# Patient Record
Sex: Male | Born: 1935 | Race: White | Hispanic: No | Smoking: Former smoker
Health system: Southern US, Community
[De-identification: ages and names within clinical notes are randomized; demographics above are authoritative.]

## PROBLEM LIST (undated history)

## (undated) DIAGNOSIS — I2510 Atherosclerotic heart disease of native coronary artery without angina pectoris: Secondary | ICD-10-CM

## (undated) DIAGNOSIS — I509 Heart failure, unspecified: Secondary | ICD-10-CM

## (undated) DIAGNOSIS — N189 Chronic kidney disease, unspecified: Secondary | ICD-10-CM

## (undated) DIAGNOSIS — I10 Essential (primary) hypertension: Secondary | ICD-10-CM

## (undated) DIAGNOSIS — I499 Cardiac arrhythmia, unspecified: Secondary | ICD-10-CM

## (undated) DIAGNOSIS — N400 Enlarged prostate without lower urinary tract symptoms: Secondary | ICD-10-CM

## (undated) DIAGNOSIS — E785 Hyperlipidemia, unspecified: Secondary | ICD-10-CM

## (undated) DIAGNOSIS — I4891 Unspecified atrial fibrillation: Secondary | ICD-10-CM

## (undated) DIAGNOSIS — M109 Gout, unspecified: Secondary | ICD-10-CM

## (undated) HISTORY — PX: HERNIA REPAIR: SHX51

## (undated) HISTORY — PX: FRACTURE SURGERY: SHX138

## (undated) HISTORY — PX: CARDIAC CATHETERIZATION: SHX172

---

## 1998-02-26 HISTORY — PX: ABDOMINAL AORTIC ANEURYSM REPAIR: SUR1152

## 1998-02-26 HISTORY — PX: CORONARY ANGIOPLASTY: SHX604

## 2000-02-27 HISTORY — PX: FEMORAL-FEMORAL BYPASS GRAFT: SHX936

## 2005-10-09 ENCOUNTER — Ambulatory Visit: Payer: Self-pay | Admitting: Specialist

## 2008-06-30 ENCOUNTER — Ambulatory Visit: Payer: Self-pay | Admitting: Specialist

## 2010-08-03 ENCOUNTER — Ambulatory Visit: Payer: Self-pay | Admitting: Cardiology

## 2012-02-27 HISTORY — PX: ILIAC ARTERY STENT: SHX1786

## 2012-09-05 ENCOUNTER — Ambulatory Visit: Payer: Self-pay | Admitting: Urology

## 2015-02-17 ENCOUNTER — Other Ambulatory Visit: Payer: Self-pay

## 2015-02-17 ENCOUNTER — Encounter
Admission: RE | Admit: 2015-02-17 | Discharge: 2015-02-17 | Disposition: A | Discharge status: Home / Self Care | Payer: Medicare Other | Source: Ambulatory Visit | Attending: Specialist | Admitting: Specialist

## 2015-02-17 DIAGNOSIS — Z01812 Encounter for preprocedural laboratory examination: Secondary | ICD-10-CM | POA: Diagnosis present

## 2015-02-17 DIAGNOSIS — Z01810 Encounter for preprocedural cardiovascular examination: Secondary | ICD-10-CM | POA: Diagnosis not present

## 2015-02-17 DIAGNOSIS — I4891 Unspecified atrial fibrillation: Secondary | ICD-10-CM | POA: Diagnosis not present

## 2015-02-17 HISTORY — DX: Essential (primary) hypertension: I10

## 2015-02-17 HISTORY — DX: Heart failure, unspecified: I50.9

## 2015-02-17 HISTORY — DX: Unspecified atrial fibrillation: I48.91

## 2015-02-17 HISTORY — DX: Chronic kidney disease, unspecified: N18.9

## 2015-02-17 HISTORY — DX: Atherosclerotic heart disease of native coronary artery without angina pectoris: I25.10

## 2015-02-17 HISTORY — DX: Cardiac arrhythmia, unspecified: I49.9

## 2015-02-17 HISTORY — DX: Hyperlipidemia, unspecified: E78.5

## 2015-02-17 LAB — CBC
HCT: 43.1 % (ref 40.0–52.0)
Hemoglobin: 14 g/dL (ref 13.0–18.0)
MCH: 30.9 pg (ref 26.0–34.0)
MCHC: 32.5 g/dL (ref 32.0–36.0)
MCV: 95 fL (ref 80.0–100.0)
PLATELETS: 140 10*3/uL — AB (ref 150–440)
RBC: 4.54 MIL/uL (ref 4.40–5.90)
RDW: 13.3 % (ref 11.5–14.5)
WBC: 6.1 10*3/uL (ref 3.8–10.6)

## 2015-02-17 LAB — PROTIME-INR
INR: 1.83
PROTHROMBIN TIME: 21.1 s — AB (ref 11.4–15.0)

## 2015-02-17 LAB — POTASSIUM: Potassium: 3.9 mmol/L (ref 3.5–5.1)

## 2015-02-17 NOTE — Patient Instructions (Signed)
  Your procedure is scheduled on: February 25, 2015 (Friday) Report to Day Surgery.[facility]) Second Floor To find out your arrival time please call [phone] between 1PM - 3PM on February 24, 2015 (Thursday).  Remember: Instructions that are not followed completely may result in serious medical risk, up to and including death, or upon the discretion of your surgeon and anesthesiologist your surgery may need to be rescheduled.    __x__ 1. Do not eat food or drink liquids after midnight. No gum chewing or hard candies.     ____ 2. No Alcohol for 24 hours before or after surgery.   ____ 3. Bring all medications with you on the day of surgery if instructed.    __x__ 4. Notify your doctor if there is any change in your medical condition     (cold, fever, infections).     Do not wear jewelry, make-up, hairpins, clips or nail polish.  Do not wear lotions, powders, or perfumes. You may wear deodorant.  Do not shave 48 hours prior to surgery. Men may shave face and neck.  Do not bring valuables to the hospital.    [facility] is not responsible for any belongings or valuables.               Contacts, dentures or bridgework may not be worn into surgery.  Leave your suitcase in the car. After surgery it may be brought to your room.  For patients admitted to the hospital, discharge time is determined by your                treatment team.   Patients discharged the day of surgery will not be allowed to drive home.   Please read over the following fact sheets that you were given:   Surgical Site Infection Prevention   ____ Take these medicines the morning of surgery with A SIP OF WATER:    1. Lisinopril  2. Metoprolol  3.   4.  5.  6.  ____ Fleet Enema (as directed)   _x___ Use CHG Soap as directed  ____ Use inhalers on the day of surgery  ____ Stop metformin 2 days prior to surgery    ____ Take 1/2 of usual insulin dose the night before surgery and none on the morning of  surgery.   _x___ Stop Coumadin/Plavix/aspirin on (Dr. Randon Goldsmith will check with Dr. Saralyn Pilar when to stop Coumadin and Aspirin)  ____ Stop Anti-inflammatories on    _x___ Stop supplements until after surgery.  (Stop Vitamin E now)  ____ Bring C-Pap to the hospital.

## 2015-02-17 NOTE — Pre-Procedure Instructions (Signed)
Dr. Laureen Abrahams notified of  EKG on February 17, 2015 and okayed patient for surgery.

## 2015-02-25 ENCOUNTER — Ambulatory Visit: Payer: Medicare Other | Admitting: Anesthesiology

## 2015-02-25 ENCOUNTER — Encounter: Payer: Self-pay | Admitting: *Deleted

## 2015-02-25 ENCOUNTER — Ambulatory Visit
Admission: RE | Admit: 2015-02-25 | Discharge: 2015-02-25 | Disposition: A | Discharge status: Home / Self Care | Payer: Medicare Other | Source: Ambulatory Visit | Attending: Specialist | Admitting: Specialist

## 2015-02-25 ENCOUNTER — Encounter
Admission: RE | Disposition: A | Discharge status: Home / Self Care | Payer: Self-pay | Source: Ambulatory Visit | Attending: Specialist

## 2015-02-25 DIAGNOSIS — I110 Hypertensive heart disease with heart failure: Secondary | ICD-10-CM | POA: Insufficient documentation

## 2015-02-25 DIAGNOSIS — J449 Chronic obstructive pulmonary disease, unspecified: Secondary | ICD-10-CM | POA: Insufficient documentation

## 2015-02-25 DIAGNOSIS — Z87891 Personal history of nicotine dependence: Secondary | ICD-10-CM | POA: Insufficient documentation

## 2015-02-25 DIAGNOSIS — G5601 Carpal tunnel syndrome, right upper limb: Secondary | ICD-10-CM | POA: Insufficient documentation

## 2015-02-25 DIAGNOSIS — Z7982 Long term (current) use of aspirin: Secondary | ICD-10-CM | POA: Diagnosis not present

## 2015-02-25 DIAGNOSIS — I2510 Atherosclerotic heart disease of native coronary artery without angina pectoris: Secondary | ICD-10-CM | POA: Insufficient documentation

## 2015-02-25 HISTORY — PX: CARPAL TUNNEL RELEASE: SHX101

## 2015-02-25 LAB — PROTIME-INR
INR: 1.04
PROTHROMBIN TIME: 13.8 s (ref 11.4–15.0)

## 2015-02-25 SURGERY — CARPAL TUNNEL RELEASE
Anesthesia: General | Site: Hand | Laterality: Right | Wound class: Clean

## 2015-02-25 MED ORDER — BUPIVACAINE HCL (PF) 0.5 % IJ SOLN
INTRAMUSCULAR | Status: AC
  Filled 2015-02-25: qty 30

## 2015-02-25 MED ORDER — PROPOFOL 10 MG/ML IV BOLUS
INTRAVENOUS | Status: DC | PRN
  Administered 2015-02-25 (×2): 100 mg via INTRAVENOUS

## 2015-02-25 MED ORDER — GLYCOPYRROLATE 0.2 MG/ML IJ SOLN
INTRAMUSCULAR | Status: DC | PRN
  Administered 2015-02-25: 0.1 mg via INTRAVENOUS

## 2015-02-25 MED ORDER — FAMOTIDINE 20 MG PO TABS
ORAL_TABLET | ORAL | Status: AC
  Administered 2015-02-25: 20 mg via ORAL
  Filled 2015-02-25: qty 1

## 2015-02-25 MED ORDER — BUPIVACAINE HCL 0.5 % IJ SOLN
INTRAMUSCULAR | Status: DC | PRN
  Administered 2015-02-25: 10 mL

## 2015-02-25 MED ORDER — LIDOCAINE HCL (CARDIAC) 20 MG/ML IV SOLN
INTRAVENOUS | Status: DC | PRN
  Administered 2015-02-25: 20 mg via INTRAVENOUS

## 2015-02-25 MED ORDER — EPHEDRINE SULFATE 50 MG/ML IJ SOLN
INTRAMUSCULAR | Status: DC | PRN
  Administered 2015-02-25: 10 mg via INTRAVENOUS

## 2015-02-25 MED ORDER — FAMOTIDINE 20 MG PO TABS
20.0000 mg | ORAL_TABLET | Freq: Once | ORAL | Status: AC
  Administered 2015-02-25: 20 mg via ORAL

## 2015-02-25 MED ORDER — MIDAZOLAM HCL 2 MG/2ML IJ SOLN
INTRAMUSCULAR | Status: DC | PRN
  Administered 2015-02-25 (×2): 1 mg via INTRAVENOUS

## 2015-02-25 MED ORDER — FENTANYL CITRATE (PF) 100 MCG/2ML IJ SOLN
INTRAMUSCULAR | Status: DC | PRN
  Administered 2015-02-25: 25 ug via INTRAVENOUS

## 2015-02-25 MED ORDER — FENTANYL CITRATE (PF) 100 MCG/2ML IJ SOLN: 25.0000 ug | INTRAMUSCULAR | Status: DC | PRN

## 2015-02-25 MED ORDER — LACTATED RINGERS IV SOLN
INTRAVENOUS | Status: DC
  Administered 2015-02-25: 07:00:00 via INTRAVENOUS

## 2015-02-25 MED ORDER — ONDANSETRON HCL 4 MG/2ML IJ SOLN
4.0000 mg | Freq: Once | INTRAMUSCULAR | Status: DC | PRN
Start: 2015-02-25 — End: 2015-02-25

## 2015-02-25 MED ORDER — ONDANSETRON HCL 4 MG/2ML IJ SOLN
INTRAMUSCULAR | Status: DC | PRN
  Administered 2015-02-25: 4 mg via INTRAVENOUS

## 2015-02-25 SURGICAL SUPPLY — 22 items
BANDAGE ELASTIC 3 LF NS (GAUZE/BANDAGES/DRESSINGS) ×3 IMPLANT
BNDG ESMARK 4X12 TAN STRL LF (GAUZE/BANDAGES/DRESSINGS) ×3 IMPLANT
CANISTER SUCT 1200ML W/VALVE (MISCELLANEOUS) ×3 IMPLANT
CAST PADDING 3X4FT ST 30246 (SOFTGOODS) ×4
CHLORAPREP W/TINT 26ML (MISCELLANEOUS) ×3 IMPLANT
GAUZE PETRO XEROFOAM 1X8 (MISCELLANEOUS) ×3 IMPLANT
GAUZE SPONGE 4X4 12PLY STRL (GAUZE/BANDAGES/DRESSINGS) ×3 IMPLANT
GLOVE BIO SURGEON STRL SZ7.5 (GLOVE) ×3 IMPLANT
GOWN STRL REUS W/ TWL LRG LVL3 (GOWN DISPOSABLE) ×2 IMPLANT
GOWN STRL REUS W/TWL LRG LVL3 (GOWN DISPOSABLE) ×4
KIT RM TURNOVER STRD PROC AR (KITS) ×3 IMPLANT
NS IRRIG 500ML POUR BTL (IV SOLUTION) ×3 IMPLANT
PACK EXTREMITY ARMC (MISCELLANEOUS) ×3 IMPLANT
PAD CAST CTTN 3X4 STRL (SOFTGOODS) ×2 IMPLANT
PAD GROUND ADULT SPLIT (MISCELLANEOUS) ×3 IMPLANT
PAD PREP 24X41 OB/GYN DISP (PERSONAL CARE ITEMS) ×3 IMPLANT
PADDING CAST 3IN STRL (MISCELLANEOUS) ×2
PADDING CAST BLEND 3X4 STRL (MISCELLANEOUS) ×1 IMPLANT
STOCKINETTE STRL 4IN 9604848 (GAUZE/BANDAGES/DRESSINGS) ×3 IMPLANT
SUT ETHILON 4-0 (SUTURE) ×2
SUT ETHILON 4-0 FS2 18XMFL BLK (SUTURE) ×1
SUTURE ETHLN 4-0 FS2 18XMF BLK (SUTURE) ×1 IMPLANT

## 2015-02-25 NOTE — H&P (Signed)
  81 year old male withright carpal tunnel syndrome.  History and physical exam has been inserted into the chart in the form of a paper document.  Heart and lungs are clear.  ENT exam normal.  Plan: right carpal tunnel release

## 2015-02-25 NOTE — Anesthesia Procedure Notes (Signed)
Procedure Name: LMA Insertion Date/Time: 02/25/2015 7:32 AM Performed by: Naomie Dean Pre-anesthesia Checklist: Patient identified, Emergency Drugs available, Suction available, Patient being monitored and Timeout performed Patient Re-evaluated:Patient Re-evaluated prior to inductionOxygen Delivery Method: Circle system utilized Preoxygenation: Pre-oxygenation with 100% oxygen Intubation Type: IV induction Ventilation: Mask ventilation without difficulty LMA Size: 4.0 Dental Injury: Teeth and Oropharynx as per pre-operative assessment

## 2015-02-25 NOTE — Progress Notes (Signed)
Dr Tamala Julian called re bandage with bloody drainage, changed with abd pads/ace wrap as instructed prior to d/c to home

## 2015-02-25 NOTE — Brief Op Note (Signed)
212/30/2016  8:16 AM  PATIENT:  Christian Silva  81 y.o. male  PRE-OPERATIVE DIAGNOSIS:  CARPAL TUNNEL SYNDROME right hand  POST-OPERATIVE DIAGNOSIS:  carpal tunnel syndrome right hand  PROCEDURE:  Procedure(s): CARPAL TUNNEL RELEASE (Right)  SURGEON:  Surgeon(s) and Role:    * Christophe Louis, MD - Primary  PHYSICIAN ASSISTANT:   ASSISTANTS: none   ANESTHESIA:   general  EBL:  Total I/O In: 500 [I.V.:500] Out: 5 [Blood:5]  BLOOD ADMINISTERED:none  DRAINS: none   LOCAL MEDICATIONS USED:  MARCAINE     SPECIMEN:  No Specimen  DISPOSITION OF SPECIMEN:  N/A  COUNTS:  YES  TOURNIQUET:    DICTATION: .Other Dictation: Dictation Number [id number]  PLAN OF CARE: Discharge to home after PACU  PATIENT DISPOSITION:  PACU - hemodynamically stable.   Delay start of Pharmacological VTE agent (>24hrs) due to surgical blood loss or risk of bleeding: not applicable

## 2015-02-25 NOTE — Op Note (Signed)
NAME:  Christian Silva, Christian Silva NO.:  [id number]  MEDICAL RECORD NO.:  [id number]  LOCATION:  [facility]                         FACILITY:  [facility]  PHYSICIAN:  Margaretmary Eddy, MD        DATE OF BIRTH:  1930/11/01  DATE OF PROCEDURE:  02/25/2015 DATE OF DISCHARGE:                              OPERATIVE REPORT   PREOPERATIVE DIAGNOSIS:  Severe right carpal tunnel syndrome.  POSTOPERATIVE DIAGNOSIS:  Severe right carpal tunnel syndrome.  PROCEDURE PERFORMED:  Right carpal tunnel release.  SURGEON:  Margaretmary Eddy, MD  SURGEON:  Margaretmary Eddy, MD  ANESTHESIA:  General.  COMPLICATIONS:  None.  TOURNIQUET TIME:  16 minutes.  DESCRIPTION OF PROCEDURE:  After adequate induction of general anesthesia, the right upper extremity was thoroughly prepped with alcohol and ChloraPrep and draped in standard sterile fashion.  The extremity was wrapped out with the Esmarch bandage and pneumatic tourniquet elevated to 250 mmHg.  Under loupe magnification, standard volar carpal tunnel incision was made and the dissection carefully carried down to the transverse retinacular ligament.  This was incised longitudinally in the midportion with the knife.  The distal release was performed with the small scissors.  The proximal release was performed with the small scissors and the carpal tunnel scissors.  The transverse carpal ligament was seen to be extremely thick and hypertrophic.  The median nerve was identified and was seen to be severely compressed directly beneath the ligament.  Careful check was made both proximally and distally to ensure that complete release had been obtained.  Skin edges were infiltrated with 0.5% plain Marcaine.  Skin was closed with 4- 0 nylon.  Soft bulky dressing was applied.  Tourniquet was released. Patient was returned to the recovery room in satisfactory condition, having tolerated the procedure quite well.          ______________________________ Margaretmary Eddy,  MD     CS/MEDQ  D:  02/25/2015  T:  02/25/2015  Job:  [id number]

## 2015-02-25 NOTE — Transfer of Care (Signed)
Immediate Anesthesia Transfer of Care Note  Patient: Christian Silva  Procedure(s) Performed: Procedure(s): CARPAL TUNNEL RELEASE (Right)  Patient Location: PACU  Anesthesia Type:General  Level of Consciousness: sedated  Airway & Oxygen Therapy: Patient Spontanous Breathing  Post-op Assessment: Report given to RN  Post vital signs: stable  Last Vitals:  Filed Vitals:   02/25/15 0615 02/25/15 0814  BP: 120/60 139/75  Pulse: 70 72  Temp: 36.4 C 36.5 C  Resp: 16 15    Complications: No apparent anesthesia complications

## 2015-02-25 NOTE — Discharge Instructions (Signed)
Elevate hand as much as possible May remove entire bandage in 48 hours, bathe, get wet, etc, cover wound with BandAid Wear brace prn after operative bandage is removed in 48 hours

## 2015-02-25 NOTE — Anesthesia Preprocedure Evaluation (Addendum)
Anesthesia Evaluation  Patient identified by MRN, date of birth, ID band Patient awake    Reviewed: Allergy & Precautions, NPO status , Patient's Chart, lab work & pertinent test results, reviewed documented beta blocker date and time   Airway Mallampati: II       Dental no notable dental hx.    Pulmonary COPD, former smoker,     + decreased breath sounds      Cardiovascular Exercise Tolerance: Good hypertension, Pt. on medications and Pt. on home beta blockers + CAD and +CHF  + dysrhythmias  Rhythm:Regular     Neuro/Psych negative neurological ROS     GI/Hepatic negative GI ROS, Neg liver ROS,   Endo/Other  negative endocrine ROS  Renal/GU      Musculoskeletal   Abdominal Normal abdominal exam  (+)   Peds  Hematology negative hematology ROS (+)   Anesthesia Other Findings   Reproductive/Obstetrics                            Anesthesia Physical Anesthesia Plan  ASA: III  Anesthesia Plan: General   Post-op Pain Management:    Induction: Intravenous  Airway Management Planned: LMA  Additional Equipment:   Intra-op Plan:   Post-operative Plan: Extubation in OR  Informed Consent: I have reviewed the patients History and Physical, chart, labs and discussed the procedure including the risks, benefits and alternatives for the proposed anesthesia with the patient or authorized representative who has indicated his/her understanding and acceptance.     Plan Discussed with: CRNA  Anesthesia Plan Comments:         Anesthesia Quick Evaluation

## 2015-02-25 NOTE — Anesthesia Postprocedure Evaluation (Signed)
Anesthesia Post Note  Patient: Christian Silva  Procedure(s) Performed: Procedure(s) (LRB): CARPAL TUNNEL RELEASE (Right)  Patient location during evaluation: PACU Anesthesia Type: General Level of consciousness: awake and alert Pain management: pain level controlled Vital Signs Assessment: post-procedure vital signs reviewed and stable Respiratory status: spontaneous breathing, nonlabored ventilation, respiratory function stable and patient connected to nasal cannula oxygen Cardiovascular status: blood pressure returned to baseline and stable Postop Assessment: no signs of nausea or vomiting Anesthetic complications: no    Last Vitals:  Filed Vitals:   02/25/15 0615 02/25/15 0814  BP: 120/60 139/75  Pulse: 70 72  Temp: 36.4 C 36.5 C  Resp: 16 15    Last Pain:  Filed Vitals:   02/25/15 0821  PainSc: [place] G Alvis Pulcini

## 2015-09-06 ENCOUNTER — Ambulatory Visit (INDEPENDENT_AMBULATORY_CARE_PROVIDER_SITE_OTHER): Payer: Medicare Other | Admitting: General Surgery

## 2015-09-06 ENCOUNTER — Encounter: Payer: Self-pay | Admitting: General Surgery

## 2015-09-06 VITALS — BP 124/78 | HR 70 | Resp 14 | Ht 71.0 in | Wt 196.0 lb

## 2015-09-06 DIAGNOSIS — L723 Sebaceous cyst: Secondary | ICD-10-CM

## 2015-09-06 DIAGNOSIS — L089 Local infection of the skin and subcutaneous tissue, unspecified: Secondary | ICD-10-CM

## 2015-09-06 NOTE — Progress Notes (Signed)
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   MRN: [id number]  Chief Complaint  Patient presents with  . 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  Here today for evaluation of a posterior right shoulder cyst that has gotten larger.  The patient reports that the area has become increasingly enlarged and somewhat tender. He reports a past history of atrial fibrillation that converted with cardioversion but has been maintained on anticoagulation.  HPI  Past Medical History  Diagnosis Date  . Atrial fibrillation ([place])   . Hypertension   . Dysrhythmia   . Coronary artery disease   . CHF (congestive heart failure) ([place])   . Chronic kidney disease     Kidney stones in past  . Hyperlipidemia     Past Surgical History  Procedure Laterality Date  . Abdominal aortic aneurysm repair  2000    [facility]  . Femoral-femoral bypass graft  2002    [facility]  . Cardiac catheterization    . Iliac artery stent  2014    [facility]  . Hernia repair      Umbilical Hernia Repair, Dr. Raylene Everts  . Carpal tunnel release Right 02/25/2015    Procedure: CARPAL TUNNEL RELEASE;  Surgeon: Christophe Louis, MD;  Location: [facility] ORS;  Service: Orthopedics;  Laterality: Right;    No family history on file.  Social History Social History  Substance Use Topics  . Smoking status: Former Smoker    Types: Pipe  . Smokeless tobacco: Former Systems developer  . Alcohol Use: No    No Known Allergies  Current Outpatient Prescriptions  Medication Sig Dispense Refill  . allopurinol (ZYLOPRIM) 300 MG tablet Take 300 mg by mouth at bedtime.    Marland Kitchen aspirin 81 MG tablet Take 81 mg by mouth at bedtime.    Marland Kitchen diltiazem (CARDIZEM CD) 240 MG 24 hr capsule Take 240 mg by mouth at bedtime.    . hydrochlorothiazide (HYDRODIURIL) 25 MG tablet Take 25 mg by mouth daily.    Marland Kitchen lisinopril (PRINIVIL,ZESTRIL) 40 MG tablet Take 40 mg by mouth daily.    . metoprolol (LOPRESSOR) 50 MG tablet  Take 50 mg by mouth 2 (two) times daily.    . niacin 500 MG tablet Take 1,000 mg by mouth at bedtime.    . rosuvastatin (CRESTOR) 10 MG tablet Take 10 mg by mouth at bedtime.    . vitamin E 400 UNIT capsule Take 400 Units by mouth daily.    Marland Kitchen warfarin (COUMADIN) 5 MG tablet Take 5 mg by mouth daily. Tues, Thurs, and Sat only.---Mon, Wed, Fri pt. takes 7.5 mg of Coumadin.     No current facility-administered medications for this visit.    Review of Systems Review of Systems  Constitutional: Negative.   Respiratory: Negative.   Cardiovascular: Negative.     Blood pressure 124/78, pulse 70, resp. rate 14, height 5\' 11"  (1.803 m), weight 196 lb (88.905 kg).  Physical Exam Physical Exam  Cardiovascular: Normal rate and regular rhythm.   Pulmonary/Chest: Effort normal and breath sounds normal.       Assessment    Inflamed sebaceous cyst of the left posterior shoulder.    Plan    Incision and drainage was recommended. 10 mL of 0.5% Xylocaine with 0.25% Marcaine with 1-200,000 of epinephrine was utilized well tolerated. ChloraPrep was applied to the skin. A transverse incision was made over the lesion  and thick material consistent with a sebaceous cyst was noted. The majority of the cyst wall could be extracted. The cavity was swabbed. The wound was left open with a dry dressing of Telfa and Tegaderm. The patient tolerated the procedure well.  The patient will change the dressing to-3 days and then as needed. We'll plan for reassessment in a few weeks to see if the area requires formal excision for retained cyst contents.     PCP:  Benita Stabile C This information has been scribed by Karie Fetch RN, BSN,BC.   Robert Bellow 09/06/2015, 9:01 PM

## 2015-09-06 NOTE — Patient Instructions (Addendum)
The patient is aware to call back for any questions or concerns. Keep area clean Follow up in 2-3 weeks.

## 2015-09-22 ENCOUNTER — Encounter: Payer: Self-pay | Admitting: *Deleted

## 2015-09-28 ENCOUNTER — Ambulatory Visit (INDEPENDENT_AMBULATORY_CARE_PROVIDER_SITE_OTHER): Payer: Medicare Other | Admitting: General Surgery

## 2015-09-28 ENCOUNTER — Encounter: Payer: Self-pay | Admitting: General Surgery

## 2015-09-28 VITALS — BP 122/78 | HR 68 | Resp 12 | Ht 72.0 in | Wt 196.0 lb

## 2015-09-28 DIAGNOSIS — L089 Local infection of the skin and subcutaneous tissue, unspecified: Secondary | ICD-10-CM

## 2015-09-28 DIAGNOSIS — L723 Sebaceous cyst: Secondary | ICD-10-CM

## 2015-09-28 NOTE — Progress Notes (Signed)
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   MRN: [id number]  Chief Complaint  Patient presents with  . 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   HPI  Past Medical History:  Diagnosis Date  . Atrial fibrillation ([facility])   . CHF (congestive heart failure) ([facility])   . Chronic kidney disease    Kidney stones in past  . Coronary artery disease   . Dysrhythmia   . Hyperlipidemia   . Hypertension     Past Surgical History:  Procedure Laterality Date  . ABDOMINAL AORTIC ANEURYSM REPAIR  2000   [facility]  . CARDIAC CATHETERIZATION    . CARPAL TUNNEL RELEASE Right 02/25/2015   Procedure: CARPAL TUNNEL RELEASE;  Surgeon: Myra Rude, MD;  Location: [facility] ORS;  Service: Orthopedics;  Laterality: Right;  . FEMORAL-FEMORAL BYPASS GRAFT  2002   [facility]  . HERNIA REPAIR     Umbilical Hernia Repair, Dr. Cathlean Marseilles  . ILIAC ARTERY STENT  [address] [place]    No family history on file.  Social History Social History  Substance Use Topics  . Smoking status: Former Smoker    Types: Pipe  . Smokeless tobacco: Former Neurosurgeon  . Alcohol use No    No Known Allergies  Current Outpatient Prescriptions  Medication Sig Dispense Refill  . allopurinol (ZYLOPRIM) 300 MG tablet Take 300 mg by mouth at bedtime.    Marland Kitchen aspirin 81 MG tablet Take 81 mg by mouth at bedtime.    Marland Kitchen diltiazem (CARDIZEM CD) 240 MG 24 hr capsule Take 240 mg by mouth at bedtime.    . hydrochlorothiazide (HYDRODIURIL) 25 MG tablet Take 25 mg by mouth daily.    Marland Kitchen lisinopril (PRINIVIL,ZESTRIL) 40 MG tablet Take 40 mg by mouth daily.    . metoprolol (LOPRESSOR) 50 MG tablet Take 50 mg by mouth 2 (two) times daily.    . niacin 500 MG tablet Take 1,000 mg by mouth at bedtime.    . rosuvastatin (CRESTOR) 10 MG tablet Take 10 mg by mouth at bedtime.    . vitamin E 400  UNIT capsule Take 400 Units by mouth daily.    Marland Kitchen warfarin (COUMADIN) 5 MG tablet Take 5 mg by mouth daily. Tues, Thurs, and Sat only.---Mon, Wed, Fri pt. takes 7.5 mg of Coumadin.     No current facility-administered medications for this visit.     Review of Systems Review of Systems  Constitutional: Negative.   Respiratory: Negative.   Cardiovascular: Negative.     Blood pressure 122/78, pulse 68, resp. rate 12, height 6' (1.829 m), weight 196 lb (88.9 kg).  Physical Exam Physical Exam  Constitutional: He is oriented to person, place, and time. He appears well-developed and well-nourished.  Pulmonary/Chest:      Neurological: He is alert and oriented to person, place, and time.  Skin: Skin is warm and dry.  1 cm opening without drainage left shoulder, silver nitrate applied.  Psychiatric: He has a normal mood and affect. His behavior is normal.     Assessment    Significant improvement since excision of the inflamed sebaceous cyst of the left posterior shoulder.    Plan    I anticipate the residual inflammation will resolve and the skin will heal  completely. He'll call if this fails to occur.    Follow up as needed. The patient is aware to call back for any questions or concerns.   This information has been scribed by Christian Fetch RN, BSN,BC.   Christian Silva 09/28/2015, 8:38 PM

## 2015-09-28 NOTE — Patient Instructions (Signed)
The patient is aware to call back for any questions or concerns.  

## 2016-05-08 ENCOUNTER — Other Ambulatory Visit: Payer: Self-pay | Admitting: Orthopedic Surgery

## 2016-05-08 DIAGNOSIS — D1720 Benign lipomatous neoplasm of skin and subcutaneous tissue of unspecified limb: Secondary | ICD-10-CM

## 2016-05-18 ENCOUNTER — Ambulatory Visit
Admission: RE | Admit: 2016-05-18 | Discharge: 2016-05-18 | Disposition: A | Discharge status: Home / Self Care | Payer: Medicare Other | Source: Ambulatory Visit | Attending: Orthopedic Surgery | Admitting: Orthopedic Surgery

## 2016-05-18 DIAGNOSIS — D1722 Benign lipomatous neoplasm of skin and subcutaneous tissue of left arm: Secondary | ICD-10-CM | POA: Insufficient documentation

## 2016-05-18 DIAGNOSIS — M19012 Primary osteoarthritis, left shoulder: Secondary | ICD-10-CM | POA: Diagnosis not present

## 2016-05-18 DIAGNOSIS — D1720 Benign lipomatous neoplasm of skin and subcutaneous tissue of unspecified limb: Secondary | ICD-10-CM

## 2016-05-18 DIAGNOSIS — M75102 Unspecified rotator cuff tear or rupture of left shoulder, not specified as traumatic: Secondary | ICD-10-CM | POA: Diagnosis not present

## 2016-05-18 LAB — POCT I-STAT CREATININE: CREATININE: 1.3 mg/dL — AB (ref 0.61–1.24)

## 2016-05-18 MED ORDER — GADOBENATE DIMEGLUMINE 529 MG/ML IV SOLN
20.0000 mL | Freq: Once | INTRAVENOUS | Status: AC | PRN
  Administered 2016-05-18: 18 mL via INTRAVENOUS

## 2017-02-11 ENCOUNTER — Other Ambulatory Visit: Payer: Self-pay | Admitting: Specialist

## 2017-02-11 DIAGNOSIS — M545 Low back pain: Secondary | ICD-10-CM

## 2017-02-12 ENCOUNTER — Ambulatory Visit
Admission: RE | Admit: 2017-02-12 | Discharge: 2017-02-12 | Disposition: A | Discharge status: Home / Self Care | Payer: Medicare Other | Source: Ambulatory Visit | Attending: Specialist | Admitting: Specialist

## 2017-02-12 ENCOUNTER — Other Ambulatory Visit: Payer: Self-pay | Admitting: Specialist

## 2017-02-12 ENCOUNTER — Other Ambulatory Visit (HOSPITAL_COMMUNITY): Payer: Self-pay | Admitting: Specialist

## 2017-02-12 ENCOUNTER — Ambulatory Visit (HOSPITAL_COMMUNITY)
Admission: RE | Admit: 2017-02-12 | Discharge: 2017-02-12 | Disposition: A | Discharge status: Home / Self Care | Payer: Medicare Other | Source: Ambulatory Visit | Attending: Specialist | Admitting: Specialist

## 2017-02-12 DIAGNOSIS — M545 Low back pain: Secondary | ICD-10-CM | POA: Diagnosis present

## 2017-02-12 DIAGNOSIS — Z95828 Presence of other vascular implants and grafts: Secondary | ICD-10-CM | POA: Insufficient documentation

## 2017-02-12 DIAGNOSIS — M47896 Other spondylosis, lumbar region: Secondary | ICD-10-CM | POA: Insufficient documentation

## 2017-07-05 ENCOUNTER — Encounter: Payer: Self-pay | Admitting: General Surgery

## 2017-11-12 ENCOUNTER — Encounter: Payer: Self-pay | Admitting: *Deleted

## 2017-11-19 ENCOUNTER — Ambulatory Visit: Payer: Medicare Other | Admitting: Certified Registered Nurse Anesthetist

## 2017-11-19 ENCOUNTER — Other Ambulatory Visit: Payer: Self-pay

## 2017-11-19 ENCOUNTER — Ambulatory Visit
Admission: RE | Admit: 2017-11-19 | Discharge: 2017-11-19 | Disposition: A | Discharge status: Home / Self Care | Payer: Medicare Other | Source: Ambulatory Visit | Attending: Ophthalmology | Admitting: Ophthalmology

## 2017-11-19 ENCOUNTER — Encounter
Admission: RE | Disposition: A | Discharge status: Home / Self Care | Payer: Self-pay | Source: Ambulatory Visit | Attending: Ophthalmology

## 2017-11-19 DIAGNOSIS — I2510 Atherosclerotic heart disease of native coronary artery without angina pectoris: Secondary | ICD-10-CM | POA: Diagnosis not present

## 2017-11-19 DIAGNOSIS — E7800 Pure hypercholesterolemia, unspecified: Secondary | ICD-10-CM | POA: Insufficient documentation

## 2017-11-19 DIAGNOSIS — Z87891 Personal history of nicotine dependence: Secondary | ICD-10-CM | POA: Diagnosis not present

## 2017-11-19 DIAGNOSIS — Z7982 Long term (current) use of aspirin: Secondary | ICD-10-CM | POA: Insufficient documentation

## 2017-11-19 DIAGNOSIS — Z79899 Other long term (current) drug therapy: Secondary | ICD-10-CM | POA: Diagnosis not present

## 2017-11-19 DIAGNOSIS — N400 Enlarged prostate without lower urinary tract symptoms: Secondary | ICD-10-CM | POA: Insufficient documentation

## 2017-11-19 DIAGNOSIS — M109 Gout, unspecified: Secondary | ICD-10-CM | POA: Insufficient documentation

## 2017-11-19 DIAGNOSIS — I509 Heart failure, unspecified: Secondary | ICD-10-CM | POA: Diagnosis not present

## 2017-11-19 DIAGNOSIS — I4891 Unspecified atrial fibrillation: Secondary | ICD-10-CM | POA: Diagnosis not present

## 2017-11-19 DIAGNOSIS — H2511 Age-related nuclear cataract, right eye: Secondary | ICD-10-CM | POA: Diagnosis not present

## 2017-11-19 DIAGNOSIS — J449 Chronic obstructive pulmonary disease, unspecified: Secondary | ICD-10-CM | POA: Diagnosis not present

## 2017-11-19 DIAGNOSIS — Z7901 Long term (current) use of anticoagulants: Secondary | ICD-10-CM | POA: Insufficient documentation

## 2017-11-19 DIAGNOSIS — I739 Peripheral vascular disease, unspecified: Secondary | ICD-10-CM | POA: Diagnosis not present

## 2017-11-19 DIAGNOSIS — Z955 Presence of coronary angioplasty implant and graft: Secondary | ICD-10-CM | POA: Insufficient documentation

## 2017-11-19 DIAGNOSIS — I110 Hypertensive heart disease with heart failure: Secondary | ICD-10-CM | POA: Diagnosis not present

## 2017-11-19 HISTORY — PX: CATARACT EXTRACTION W/PHACO: SHX586

## 2017-11-19 HISTORY — DX: Benign prostatic hyperplasia without lower urinary tract symptoms: N40.0

## 2017-11-19 HISTORY — DX: Gout, unspecified: M10.9

## 2017-11-19 SURGERY — PHACOEMULSIFICATION, CATARACT, WITH IOL INSERTION
Anesthesia: Monitor Anesthesia Care | Site: Eye | Laterality: Right

## 2017-11-19 MED ORDER — SODIUM CHLORIDE 0.9 % IV SOLN
INTRAVENOUS | Status: DC
  Administered 2017-11-19: 07:00:00 via INTRAVENOUS

## 2017-11-19 MED ORDER — MOXIFLOXACIN HCL 0.5 % OP SOLN
OPHTHALMIC | Status: AC
  Filled 2017-11-19: qty 3

## 2017-11-19 MED ORDER — MIDAZOLAM HCL 2 MG/2ML IJ SOLN
INTRAMUSCULAR | Status: AC
  Filled 2017-11-19: qty 2

## 2017-11-19 MED ORDER — ARMC OPHTHALMIC DILATING DROPS
1.0000 "application " | OPHTHALMIC | Status: AC
  Administered 2017-11-19 (×3): 1 via OPHTHALMIC

## 2017-11-19 MED ORDER — LIDOCAINE HCL (PF) 4 % IJ SOLN
INTRAMUSCULAR | Status: AC
  Filled 2017-11-19: qty 5

## 2017-11-19 MED ORDER — NA CHONDROIT SULF-NA HYALURON 40-17 MG/ML IO SOLN
INTRAOCULAR | Status: DC | PRN
  Administered 2017-11-19: 1 mL via INTRAOCULAR

## 2017-11-19 MED ORDER — CARBACHOL 0.01 % IO SOLN
INTRAOCULAR | Status: DC | PRN
  Administered 2017-11-19: .5 mL via INTRAOCULAR

## 2017-11-19 MED ORDER — TETRACAINE HCL 0.5 % OP SOLN
1.0000 [drp] | OPHTHALMIC | Status: AC | PRN
  Administered 2017-11-19 (×3): 1 [drp] via OPHTHALMIC

## 2017-11-19 MED ORDER — LIDOCAINE HCL (PF) 4 % IJ SOLN
INTRAOCULAR | Status: DC | PRN
  Administered 2017-11-19: 2 mL via OPHTHALMIC

## 2017-11-19 MED ORDER — MOXIFLOXACIN HCL 0.5 % OP SOLN
OPHTHALMIC | Status: DC | PRN
  Administered 2017-11-19: .2 mL via OPHTHALMIC

## 2017-11-19 MED ORDER — MOXIFLOXACIN HCL 0.5 % OP SOLN: 1.0000 [drp] | OPHTHALMIC | Status: DC | PRN

## 2017-11-19 MED ORDER — EPINEPHRINE PF 1 MG/ML IJ SOLN
INTRAOCULAR | Status: DC | PRN
  Administered 2017-11-19: 1 mL via OPHTHALMIC

## 2017-11-19 MED ORDER — POVIDONE-IODINE 5 % OP SOLN
OPHTHALMIC | Status: DC | PRN
  Administered 2017-11-19: 1 via OPHTHALMIC

## 2017-11-19 MED ORDER — TETRACAINE HCL 0.5 % OP SOLN
OPHTHALMIC | Status: AC
  Administered 2017-11-19: 1 [drp] via OPHTHALMIC
  Filled 2017-11-19: qty 4

## 2017-11-19 MED ORDER — POVIDONE-IODINE 5 % OP SOLN
OPHTHALMIC | Status: AC
  Filled 2017-11-19: qty 30

## 2017-11-19 MED ORDER — NA CHONDROIT SULF-NA HYALURON 40-17 MG/ML IO SOLN
INTRAOCULAR | Status: AC
  Filled 2017-11-19: qty 1

## 2017-11-19 MED ORDER — MIDAZOLAM HCL 2 MG/2ML IJ SOLN
INTRAMUSCULAR | Status: DC | PRN
  Administered 2017-11-19: 1 mg via INTRAVENOUS

## 2017-11-19 MED ORDER — EPINEPHRINE PF 1 MG/ML IJ SOLN
INTRAMUSCULAR | Status: AC
  Filled 2017-11-19: qty 1

## 2017-11-19 MED ORDER — ARMC OPHTHALMIC DILATING DROPS
OPHTHALMIC | Status: AC
  Administered 2017-11-19: 1 via OPHTHALMIC
  Filled 2017-11-19: qty 0.5

## 2017-11-19 SURGICAL SUPPLY — 16 items

## 2017-11-19 NOTE — Transfer of Care (Signed)
Immediate Anesthesia Transfer of Care Note  Patient: [name], MD  Procedure(s) Performed: CATARACT EXTRACTION PHACO AND INTRAOCULAR LENS PLACEMENT (IOC) (Right Eye)  Patient Location: Short Stay  Anesthesia Type:MAC  Level of Consciousness: awake, alert , oriented and patient cooperative  Airway & Oxygen Therapy: Patient Spontanous Breathing  Post-op Assessment: Report given to RN and Post -op Vital signs reviewed and stable  Post vital signs: Reviewed and stable  Last Vitals:  Vitals Value Taken Time  BP 121/56 11/19/2017  8:46 AM  Temp 36.2 C 11/19/2017  8:46 AM  Pulse 53 11/19/2017  8:46 AM  Resp 16 11/19/2017  8:46 AM  SpO2 99 % 11/19/2017  8:46 AM    Last Pain:  Vitals:   11/19/17 0846  TempSrc: Temporal  PainSc: 0-No pain         Complications: No apparent anesthesia complications

## 2017-11-19 NOTE — Discharge Instructions (Signed)
Eye Surgery Discharge Instructions    Expect mild scratchy sensation or mild soreness. DO NOT RUB YOUR EYE!  The day of surgery:  Minimal physical activity, but bed rest is not required  No reading, computer work, or close hand work  No bending, lifting, or straining.  May watch TV  For 24 hours:  No driving, legal decisions, or alcoholic beverages  Safety precautions  Eat anything you prefer: It is better to start with liquids, then soup then solid foods.  _____ Eye patch should be worn until postoperative exam tomorrow.  ____ Solar shield eyeglasses should be worn for comfort in the sunlight/patch while sleeping  Resume all regular medications including aspirin or Coumadin if these were discontinued prior to surgery. You may shower, bathe, shave, or wash your hair. Tylenol may be taken for mild discomfort.  Call your doctor if you experience significant pain, nausea, or vomiting, fever > 101 or other signs of infection. [phone] or [phone] Specific instructions:  Follow-up Information    Birder Robson, MD Follow up on 11/20/2017.   Specialty:  Ophthalmology Why:  appointment time 8:30 AM Contact information: [address] [place] [address] [phone]

## 2017-11-19 NOTE — Anesthesia Post-op Follow-up Note (Signed)
Anesthesia QCDR form completed.        

## 2017-11-19 NOTE — Op Note (Signed)
PREOPERATIVE DIAGNOSIS:  Nuclear sclerotic cataract of the right eye.   POSTOPERATIVE DIAGNOSIS:  nuclear sclerotic cataract right eye   OPERATIVE PROCEDURE: Procedure(s): CATARACT EXTRACTION PHACO AND INTRAOCULAR LENS PLACEMENT (IOC)   SURGEON:  Birder Robson, MD.   ANESTHESIA:  Anesthesiologist: Alvin Critchley, MD CRNA: Eben Burow, CRNA; Jonna Clark, CRNA  1.      Managed anesthesia care. 2.      0.30ml of Shugarcaine was instilled in the eye following the paracentesis.   COMPLICATIONS:  None.   TECHNIQUE:   Stop and chop   DESCRIPTION OF PROCEDURE:  The patient was examined and consented in the preoperative holding area where the aforementioned topical anesthesia was applied to the right eye and then brought back to the Operating Room where the right eye was prepped and draped in the usual sterile ophthalmic fashion and a lid speculum was placed. A paracentesis was created with the side port blade and the anterior chamber was filled with viscoelastic. A near clear corneal incision was performed with the steel keratome. A continuous curvilinear capsulorrhexis was performed with a cystotome followed by the capsulorrhexis forceps. Hydrodissection and hydrodelineation were carried out with BSS on a blunt cannula. The lens was removed in a stop and chop  technique and the remaining cortical material was removed with the irrigation-aspiration handpiece. The capsular bag was inflated with viscoelastic and the Technis [id number]  lens was placed in the capsular bag without complication. The remaining viscoelastic was removed from the eye with the irrigation-aspiration handpiece. The wounds were hydrated. The anterior chamber was flushed with Miostat and the eye was inflated to physiologic pressure. 0.55ml of Vigamox was placed in the anterior chamber. The wounds were found to be water tight. The eye was dressed with Vigamox. The patient was given protective glasses to wear throughout the day and  a shield with which to sleep tonight. The patient was also given drops with which to begin a drop regimen today and will follow-up with me in one day. Implant Name Type Inv. Item Serial No. Manufacturer Lot No. LRB No. Used  LENS IOL DIOP 21.0 - [id number] Intraocular Lens LENS IOL DIOP 21.0 [id number] AMO  Right 1   Procedure(s) with comments: CATARACT EXTRACTION PHACO AND INTRAOCULAR LENS PLACEMENT (IOC) (Right) - [place] 00:48 AP% 13.7 CDE 6.61 Fluid pack lot # [id number]  Electronically signed: Birder Robson 11/19/2017 8:45 AM

## 2017-11-19 NOTE — Anesthesia Postprocedure Evaluation (Signed)
Anesthesia Post Note  Patient: Christian Clines, MD  Procedure(s) Performed: CATARACT EXTRACTION PHACO AND INTRAOCULAR LENS PLACEMENT ([place]) (Right Eye)  Patient location during evaluation: Short Stay Anesthesia Type: MAC Level of consciousness: awake and alert, oriented and patient cooperative Pain management: satisfactory to patient Vital Signs Assessment: post-procedure vital signs reviewed and stable Respiratory status: spontaneous breathing, respiratory function stable and nonlabored ventilation Cardiovascular status: blood pressure returned to baseline and stable Postop Assessment: no headache and no apparent nausea or vomiting Anesthetic complications: no     Last Vitals:  Vitals:   11/19/17 0703 11/19/17 0846  BP: 136/65 (!) 121/56  Pulse: (!) 58 (!) 53  Resp: 16 16  Temp: (!) 36.2 C (!) 36.2 C  SpO2: 100% 99%    Last Pain:  Vitals:   11/19/17 0846  TempSrc: Temporal  PainSc: 0-No pain                 Eben Burow

## 2017-11-19 NOTE — Anesthesia Preprocedure Evaluation (Signed)
Anesthesia Evaluation  Patient identified by MRN, date of birth, ID band Patient awake    Reviewed: Allergy & Precautions, NPO status , Patient's Chart, lab work & pertinent test results, reviewed documented beta blocker date and time   Airway Mallampati: II       Dental no notable dental hx.    Pulmonary COPD, former smoker,     + decreased breath sounds      Cardiovascular Exercise Tolerance: Good hypertension, Pt. on medications and Pt. on home beta blockers + CAD, + Peripheral Vascular Disease and +CHF  + dysrhythmias Atrial Fibrillation  Rhythm:Regular     Neuro/Psych negative neurological ROS     GI/Hepatic negative GI ROS, Neg liver ROS,   Endo/Other  negative endocrine ROS  Renal/GU Renal InsufficiencyRenal disease     Musculoskeletal   Abdominal Normal abdominal exam  (+)   Peds negative pediatric ROS (+)  Hematology negative hematology ROS (+)   Anesthesia Other Findings Past Medical History: No date: Atrial fibrillation (HCC) No date: BPH (benign prostatic hyperplasia) No date: CHF (congestive heart failure) (HCC) No date: Chronic kidney disease     Comment:  Kidney stones in past No date: Coronary artery disease No date: Dysrhythmia No date: Gout No date: Hyperlipidemia No date: Hypertension  Reproductive/Obstetrics                             Anesthesia Physical  Anesthesia Plan  ASA: III  Anesthesia Plan: MAC   Post-op Pain Management:    Induction: Intravenous  PONV Risk Score and Plan:   Airway Management Planned: Nasal Cannula  Additional Equipment:   Intra-op Plan:   Post-operative Plan:   Informed Consent: I have reviewed the patients History and Physical, chart, labs and discussed the procedure including the risks, benefits and alternatives for the proposed anesthesia with the patient or authorized representative who has indicated his/her  understanding and acceptance.     Plan Discussed with: CRNA  Anesthesia Plan Comments:         Anesthesia Quick Evaluation

## 2017-11-19 NOTE — H&P (Signed)
All labs reviewed. Abnormal studies sent to patients PCP when indicated.  Previous H&P reviewed, patient examined, there are NO CHANGES.  Christian Silva Porfilio9/24/20198:19 AM

## 2017-11-20 ENCOUNTER — Encounter: Payer: Self-pay | Admitting: Ophthalmology

## 2017-12-11 ENCOUNTER — Encounter: Payer: Self-pay | Admitting: *Deleted

## 2017-12-17 ENCOUNTER — Ambulatory Visit: Payer: Medicare Other | Admitting: Anesthesiology

## 2017-12-17 ENCOUNTER — Other Ambulatory Visit: Payer: Self-pay

## 2017-12-17 ENCOUNTER — Encounter
Admission: RE | Disposition: A | Discharge status: Home / Self Care | Payer: Self-pay | Source: Ambulatory Visit | Attending: Ophthalmology

## 2017-12-17 ENCOUNTER — Encounter: Payer: Self-pay | Admitting: Anesthesiology

## 2017-12-17 ENCOUNTER — Ambulatory Visit
Admission: RE | Admit: 2017-12-17 | Discharge: 2017-12-17 | Disposition: A | Discharge status: Home / Self Care | Payer: Medicare Other | Source: Ambulatory Visit | Attending: Ophthalmology | Admitting: Ophthalmology

## 2017-12-17 DIAGNOSIS — Z79899 Other long term (current) drug therapy: Secondary | ICD-10-CM | POA: Diagnosis not present

## 2017-12-17 DIAGNOSIS — E7800 Pure hypercholesterolemia, unspecified: Secondary | ICD-10-CM | POA: Insufficient documentation

## 2017-12-17 DIAGNOSIS — N400 Enlarged prostate without lower urinary tract symptoms: Secondary | ICD-10-CM | POA: Insufficient documentation

## 2017-12-17 DIAGNOSIS — Z87891 Personal history of nicotine dependence: Secondary | ICD-10-CM | POA: Diagnosis not present

## 2017-12-17 DIAGNOSIS — I4891 Unspecified atrial fibrillation: Secondary | ICD-10-CM | POA: Insufficient documentation

## 2017-12-17 DIAGNOSIS — Z7982 Long term (current) use of aspirin: Secondary | ICD-10-CM | POA: Diagnosis not present

## 2017-12-17 DIAGNOSIS — I2510 Atherosclerotic heart disease of native coronary artery without angina pectoris: Secondary | ICD-10-CM | POA: Diagnosis not present

## 2017-12-17 DIAGNOSIS — Z955 Presence of coronary angioplasty implant and graft: Secondary | ICD-10-CM | POA: Diagnosis not present

## 2017-12-17 DIAGNOSIS — I110 Hypertensive heart disease with heart failure: Secondary | ICD-10-CM | POA: Diagnosis not present

## 2017-12-17 DIAGNOSIS — M109 Gout, unspecified: Secondary | ICD-10-CM | POA: Diagnosis not present

## 2017-12-17 DIAGNOSIS — H2512 Age-related nuclear cataract, left eye: Secondary | ICD-10-CM | POA: Diagnosis not present

## 2017-12-17 DIAGNOSIS — I509 Heart failure, unspecified: Secondary | ICD-10-CM | POA: Diagnosis not present

## 2017-12-17 HISTORY — PX: CATARACT EXTRACTION W/PHACO: SHX586

## 2017-12-17 SURGERY — PHACOEMULSIFICATION, CATARACT, WITH IOL INSERTION
Anesthesia: Monitor Anesthesia Care | Site: Eye | Laterality: Left

## 2017-12-17 MED ORDER — ARMC OPHTHALMIC DILATING DROPS
OPHTHALMIC | Status: AC
  Administered 2017-12-17: 1 via OPHTHALMIC
  Filled 2017-12-17: qty 0.5

## 2017-12-17 MED ORDER — LIDOCAINE HCL (PF) 4 % IJ SOLN
INTRAMUSCULAR | Status: AC
  Filled 2017-12-17: qty 5

## 2017-12-17 MED ORDER — EPINEPHRINE PF 1 MG/ML IJ SOLN
INTRAOCULAR | Status: DC | PRN
  Administered 2017-12-17: 1 mL via OPHTHALMIC

## 2017-12-17 MED ORDER — ARMC OPHTHALMIC DILATING DROPS
1.0000 "application " | OPHTHALMIC | Status: AC
  Administered 2017-12-17 (×3): 1 via OPHTHALMIC

## 2017-12-17 MED ORDER — ONDANSETRON HCL 4 MG/2ML IJ SOLN: 4.0000 mg | Freq: Once | INTRAMUSCULAR | Status: DC | PRN

## 2017-12-17 MED ORDER — MOXIFLOXACIN HCL 0.5 % OP SOLN: 1.0000 [drp] | OPHTHALMIC | Status: DC | PRN

## 2017-12-17 MED ORDER — FENTANYL CITRATE (PF) 100 MCG/2ML IJ SOLN
INTRAMUSCULAR | Status: DC | PRN
  Administered 2017-12-17: 50 ug via INTRAVENOUS

## 2017-12-17 MED ORDER — MOXIFLOXACIN HCL 0.5 % OP SOLN
OPHTHALMIC | Status: DC | PRN
  Administered 2017-12-17: .2 mL via OPHTHALMIC

## 2017-12-17 MED ORDER — SODIUM CHLORIDE 0.9 % IV SOLN
INTRAVENOUS | Status: DC
  Administered 2017-12-17: 09:00:00 via INTRAVENOUS

## 2017-12-17 MED ORDER — TETRACAINE HCL 0.5 % OP SOLN
1.0000 [drp] | OPHTHALMIC | Status: AC | PRN
  Administered 2017-12-17 (×3): 1 [drp] via OPHTHALMIC

## 2017-12-17 MED ORDER — NA CHONDROIT SULF-NA HYALURON 40-17 MG/ML IO SOLN
INTRAOCULAR | Status: AC
  Filled 2017-12-17: qty 1

## 2017-12-17 MED ORDER — MIDAZOLAM HCL 2 MG/2ML IJ SOLN
INTRAMUSCULAR | Status: AC
  Filled 2017-12-17: qty 2

## 2017-12-17 MED ORDER — LIDOCAINE HCL (PF) 4 % IJ SOLN
INTRAOCULAR | Status: DC | PRN
  Administered 2017-12-17: 2 mL via OPHTHALMIC

## 2017-12-17 MED ORDER — MOXIFLOXACIN HCL 0.5 % OP SOLN
OPHTHALMIC | Status: AC
  Filled 2017-12-17: qty 3

## 2017-12-17 MED ORDER — FENTANYL CITRATE (PF) 100 MCG/2ML IJ SOLN
INTRAMUSCULAR | Status: AC
  Filled 2017-12-17: qty 2

## 2017-12-17 MED ORDER — NA CHONDROIT SULF-NA HYALURON 40-17 MG/ML IO SOLN
INTRAOCULAR | Status: DC | PRN
  Administered 2017-12-17: 1 mL via INTRAOCULAR

## 2017-12-17 MED ORDER — FENTANYL CITRATE (PF) 100 MCG/2ML IJ SOLN: 25.0000 ug | INTRAMUSCULAR | Status: DC | PRN

## 2017-12-17 MED ORDER — TETRACAINE HCL 0.5 % OP SOLN
OPHTHALMIC | Status: AC
  Filled 2017-12-17: qty 4

## 2017-12-17 MED ORDER — POVIDONE-IODINE 5 % OP SOLN
OPHTHALMIC | Status: DC | PRN
  Administered 2017-12-17: 1 via OPHTHALMIC

## 2017-12-17 MED ORDER — POVIDONE-IODINE 5 % OP SOLN
OPHTHALMIC | Status: AC
  Filled 2017-12-17: qty 30

## 2017-12-17 MED ORDER — MIDAZOLAM HCL 2 MG/2ML IJ SOLN
INTRAMUSCULAR | Status: DC | PRN
  Administered 2017-12-17: 1 mg via INTRAVENOUS

## 2017-12-17 MED ORDER — CARBACHOL 0.01 % IO SOLN
INTRAOCULAR | Status: DC | PRN
  Administered 2017-12-17: .5 mL via INTRAOCULAR

## 2017-12-17 MED ORDER — EPINEPHRINE PF 1 MG/ML IJ SOLN
INTRAMUSCULAR | Status: AC
  Filled 2017-12-17: qty 1

## 2017-12-17 SURGICAL SUPPLY — 16 items

## 2017-12-17 NOTE — Op Note (Signed)
PREOPERATIVE DIAGNOSIS:  Nuclear sclerotic cataract of the left eye.   POSTOPERATIVE DIAGNOSIS:  Nuclear sclerotic cataract of the left eye.   OPERATIVE PROCEDURE: Procedure(s): CATARACT EXTRACTION PHACO AND INTRAOCULAR LENS PLACEMENT ([facility])   SURGEON:  Birder Robson, MD.   ANESTHESIA:  Anesthesiologist: Emmie Niemann, MD CRNA: Vaughan Sine  1.      Managed anesthesia care. 2.     0.64ml of Shugarcaine was instilled following the paracentesis   COMPLICATIONS:  None.   TECHNIQUE:   Stop and chop   DESCRIPTION OF PROCEDURE:  The patient was examined and consented in the preoperative holding area where the aforementioned topical anesthesia was applied to the left eye and then brought back to the Operating Room where the left eye was prepped and draped in the usual sterile ophthalmic fashion and a lid speculum was placed. A paracentesis was created with the side port blade and the anterior chamber was filled with viscoelastic. A near clear corneal incision was performed with the steel keratome. A continuous curvilinear capsulorrhexis was performed with a cystotome followed by the capsulorrhexis forceps. Hydrodissection and hydrodelineation were carried out with BSS on a blunt cannula. The lens was removed in a stop and chop  technique and the remaining cortical material was removed with the irrigation-aspiration handpiece. The capsular bag was inflated with viscoelastic and the Technis [id number] lens was placed in the capsular bag without complication. The remaining viscoelastic was removed from the eye with the irrigation-aspiration handpiece. The wounds were hydrated. The anterior chamber was flushed with Miostat and the eye was inflated to physiologic pressure. 0.30ml Vigamox was placed in the anterior chamber. The wounds were found to be water tight. The eye was dressed with Vigamox. The patient was given protective glasses to wear throughout the day and a shield with which to sleep tonight.  The patient was also given drops with which to begin a drop regimen today and will follow-up with me in one day. Implant Name Type Inv. Item Serial No. Manufacturer Lot No. LRB No. Used  LENS IOL DIOP 21.0 - [id number] Intraocular Lens LENS IOL DIOP 21.0 [id number]  Left 1    Procedure(s) with comments: CATARACT EXTRACTION PHACO AND INTRAOCULAR LENS PLACEMENT ([facility]) (Left) - [place] 00:44 CDE 6.11 Fluid pack lot # [id number]  Electronically signed: Birder Robson 12/17/2017 9:57 AM

## 2017-12-17 NOTE — Anesthesia Preprocedure Evaluation (Signed)
Anesthesia Evaluation  Patient identified by MRN, date of birth, ID band Patient awake    Reviewed: Allergy & Precautions, NPO status , Patient's Chart, lab work & pertinent test results  History of Anesthesia Complications Negative for: history of anesthetic complications  Airway Mallampati: II  TM Distance: >3 FB Neck ROM: Full    Dental  (+) Implants   Pulmonary neg sleep apnea, neg COPD, former smoker,    breath sounds clear to auscultation- rhonchi (-) wheezing      Cardiovascular hypertension, + CAD, + Cardiac Stents (remote), + Peripheral Vascular Disease and +CHF  + dysrhythmias Atrial Fibrillation  Rhythm:Regular Rate:Normal - Systolic murmurs and - Diastolic murmurs Echo 3/75/43:  Normal left ventricular systolic function, ejection fraction 55 to 60%  Aortic sclerosis  Dilated ascending aorta - mild  Normal right ventricular systolic function  Tricuspid regurgitation - mild   Neuro/Psych negative neurological ROS  negative psych ROS   GI/Hepatic negative GI ROS, Neg liver ROS,   Endo/Other  negative endocrine ROSneg diabetes  Renal/GU Renal InsufficiencyRenal disease     Musculoskeletal negative musculoskeletal ROS (+)   Abdominal (+) - obese,   Peds  Hematology negative hematology ROS (+)   Anesthesia Other Findings Past Medical History: No date: Atrial fibrillation (HCC) No date: BPH (benign prostatic hyperplasia) No date: CHF (congestive heart failure) (HCC)     Comment:  1999 No date: Chronic kidney disease     Comment:  Kidney stones in past No date: Coronary artery disease No date: Dysrhythmia No date: Gout No date: Hyperlipidemia No date: Hypertension   Reproductive/Obstetrics                             Anesthesia Physical Anesthesia Plan  ASA: III  Anesthesia Plan: MAC   Post-op Pain Management:    Induction: Intravenous  PONV Risk Score and  Plan: 1 and Midazolam  Airway Management Planned: Natural Airway  Additional Equipment:   Intra-op Plan:   Post-operative Plan:   Informed Consent: I have reviewed the patients History and Physical, chart, labs and discussed the procedure including the risks, benefits and alternatives for the proposed anesthesia with the patient or authorized representative who has indicated his/her understanding and acceptance.     Plan Discussed with: CRNA and Anesthesiologist  Anesthesia Plan Comments:         Anesthesia Quick Evaluation

## 2017-12-17 NOTE — Anesthesia Postprocedure Evaluation (Signed)
Anesthesia Post Note  Patient: [name], MD  Procedure(s) Performed: CATARACT EXTRACTION PHACO AND INTRAOCULAR LENS PLACEMENT ([place]) (Left Eye)  Patient location during evaluation: PACU Anesthesia Type: MAC Level of consciousness: awake and alert and oriented Pain management: pain level controlled Vital Signs Assessment: post-procedure vital signs reviewed and stable Respiratory status: spontaneous breathing, nonlabored ventilation and respiratory function stable Cardiovascular status: blood pressure returned to baseline and stable Postop Assessment: no signs of nausea or vomiting Anesthetic complications: no     Last Vitals:  Vitals:   12/17/17 0834 12/17/17 0959  BP: 124/62 (!) 118/49  Pulse: 61 (!) 57  Resp: 17 16  Temp: (!) 36 C (!) 35.8 C  SpO2: 96% 100%    Last Pain:  Vitals:   12/17/17 0959  TempSrc: Temporal  PainSc: 0-No pain                 Loyed Wilmes

## 2017-12-17 NOTE — Anesthesia Procedure Notes (Signed)
Procedure Name: MAC Performed by: Vaughan Sine Pre-anesthesia Checklist: Patient identified, Emergency Drugs available, Suction available, Patient being monitored and Timeout performed Patient Re-evaluated:Patient Re-evaluated prior to induction Oxygen Delivery Method: Nasal cannula Preoxygenation: Pre-oxygenation with 100% oxygen Induction Type: IV induction Placement Confirmation: positive ETCO2 and CO2 detector

## 2017-12-17 NOTE — H&P (Signed)
All labs reviewed. Abnormal studies sent to patients PCP when indicated.  Previous H&P reviewed, patient examined, there are NO CHANGES.  Christian Tarr Porfilio10/22/20199:34 AM

## 2017-12-17 NOTE — Transfer of Care (Signed)
Immediate Anesthesia Transfer of Care Note  Patient: Christian Clines, MD  Procedure(s) Performed: CATARACT EXTRACTION PHACO AND INTRAOCULAR LENS PLACEMENT ([place]) (Left Eye)  Patient Location: PACU  Anesthesia Type:MAC  Level of Consciousness: awake, alert  and oriented  Airway & Oxygen Therapy: Patient Spontanous Breathing  Post-op Assessment: Report given to RN and Post -op Vital signs reviewed and stable  Post vital signs: Reviewed and stable  Last Vitals:  Vitals Value Taken Time  BP    Temp    Pulse    Resp    SpO2      Last Pain:  Vitals:   12/17/17 0834  TempSrc: Temporal  PainSc: 0-No pain         Complications: No apparent anesthesia complications

## 2017-12-17 NOTE — Discharge Instructions (Addendum)
Eye Surgery Discharge Instructions  Expect mild scratchy sensation or mild soreness. DO NOT RUB YOUR EYE!  The day of surgery:  Minimal physical activity, but bed rest is not required  No reading, computer work, or close hand work  No bending, lifting, or straining.  May watch TV  For 24 hours:  No driving, legal decisions, or alcoholic beverages  Safety precautions  Eat anything you prefer: It is better to start with liquids, then soup then solid foods.  Solar shield eyeglasses should be worn for comfort in the sunlight/patch while sleeping  Resume all regular medications including aspirin or Coumadin if these were discontinued prior to surgery. You may shower, bathe, shave, or wash your hair. Tylenol may be taken for mild discomfort. Follow Dr. Inda Coke eye drop instruction sheet as reviewed.  Call your doctor if you experience significant pain, nausea, or vomiting, fever > 101 or other signs of infection. [phone] or [phone] Specific instructions:  Follow-up Information    Birder Robson, MD Follow up.   Specialty:  Ophthalmology Why:  12/18/17 @ 10:50 AM Contact information: [address] [place] [address] [phone]

## 2017-12-17 NOTE — Anesthesia Post-op Follow-up Note (Signed)
Anesthesia QCDR form completed.        

## 2019-06-13 IMAGING — MR MR LUMBAR SPINE W/O CM
4 of 5 series · 19 of 48 positions shown · non-contrast
Comparison: None.

CLINICAL DATA: Low back pain

EXAM:
MRI LUMBAR SPINE WITHOUT CONTRAST
TECHNIQUE: Multiplanar, multisequence MR imaging of the lumbar spine was
performed. No intravenous contrast was administered.

[Series 4: T1 · sagittal · 4.0mm · 0.55mm/px · 3 of 14 slices shown (1 of 2)]
[im 3/14]
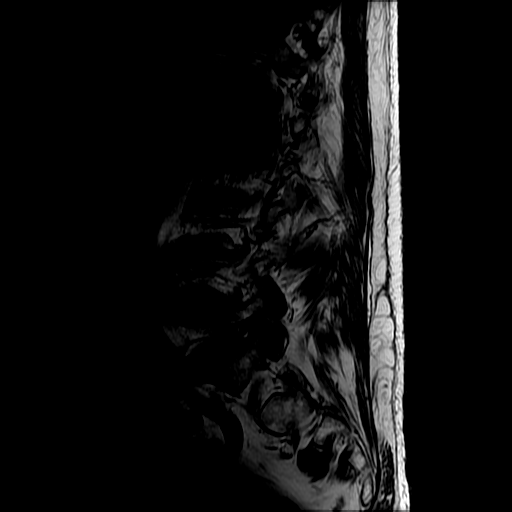
[im 8/14]
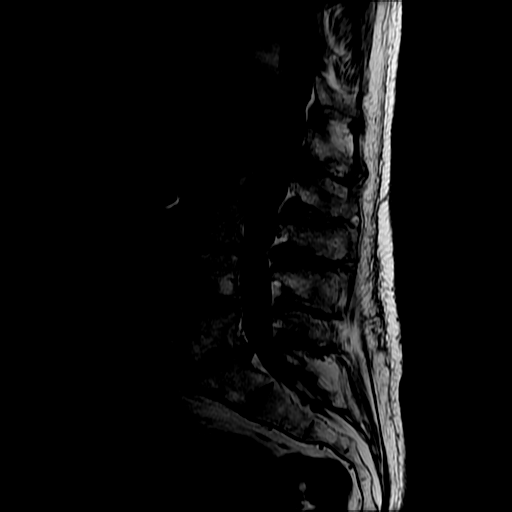
[im 14/14]
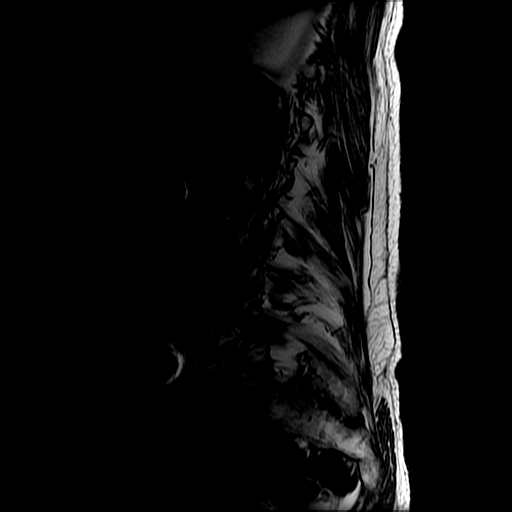

[Series 6: T2 · sagittal · 4.0mm · 0.55mm/px · 7 of 14 slices shown (1 of 2)]
[im 1/14]
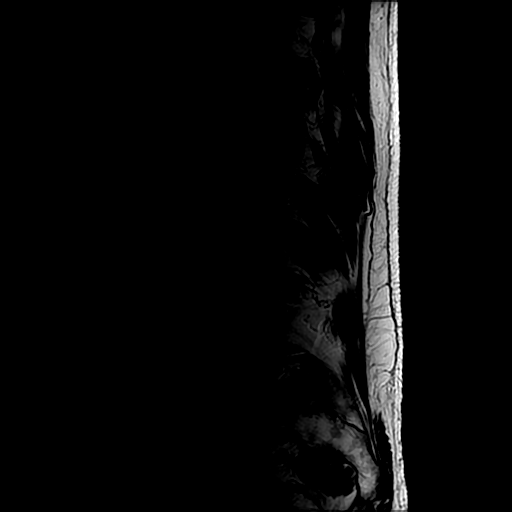
[im 3/14]
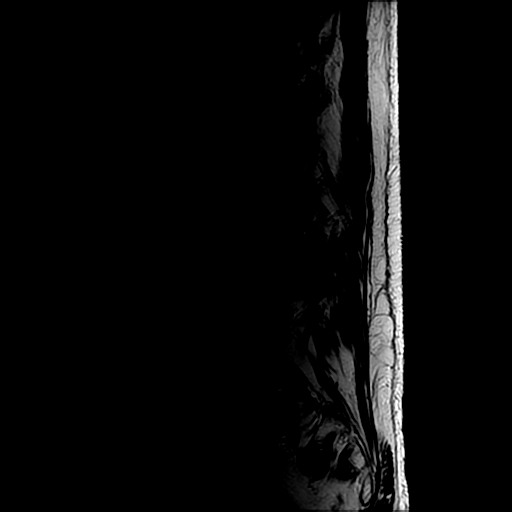
[im 5/14]
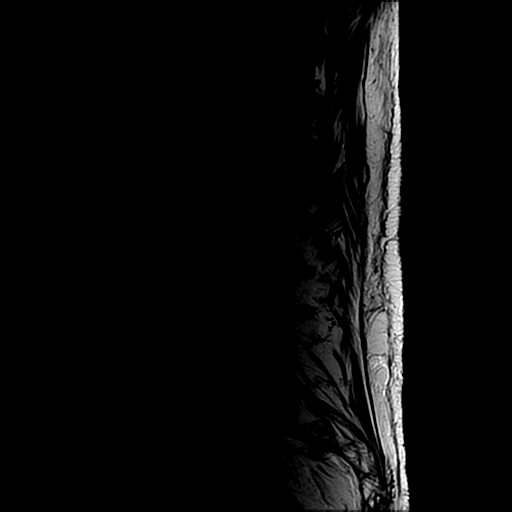
[im 7/14]
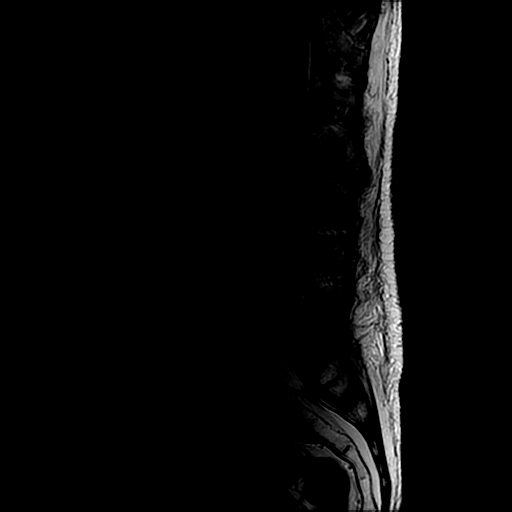
[im 9/14]
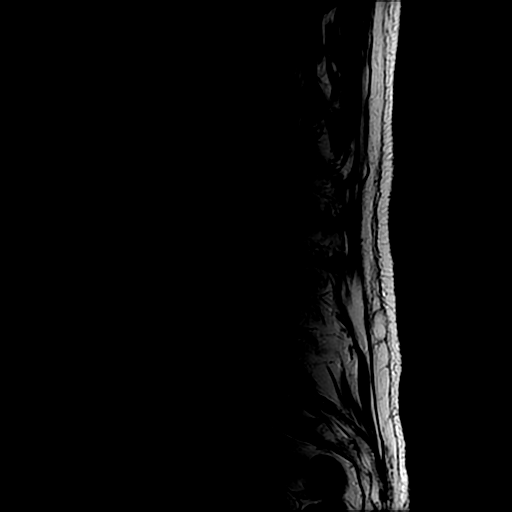
[im 11/14]
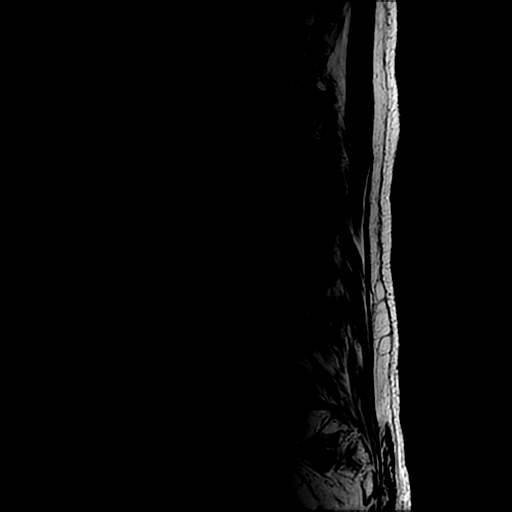
[im 14/14]
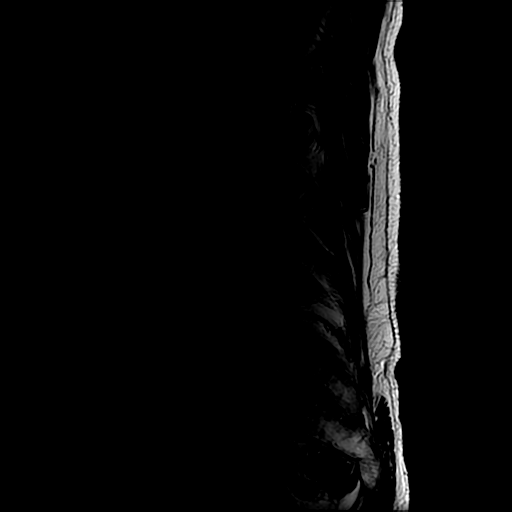

[Series 7: T1 · axial · 4.0mm · 0.39mm/px · z∈[+19,+128]mm · 3 of 28 slices shown (2 of 2)]
[im 5/28]
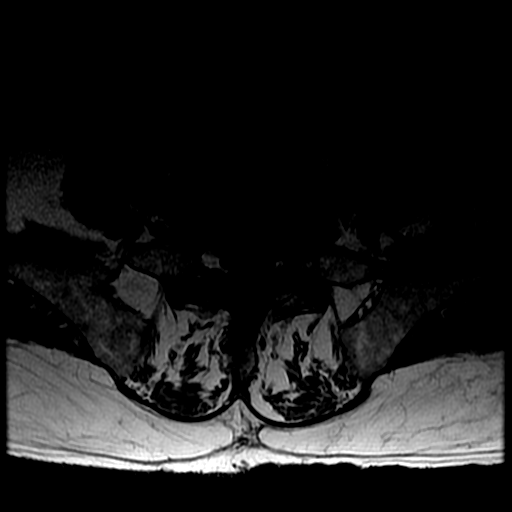
[im 15/28]
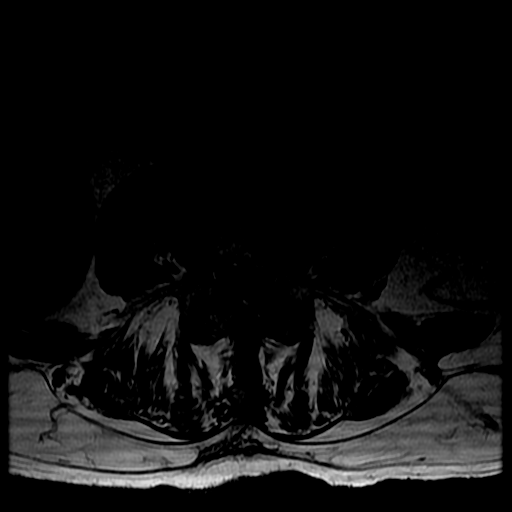
[im 23/28]
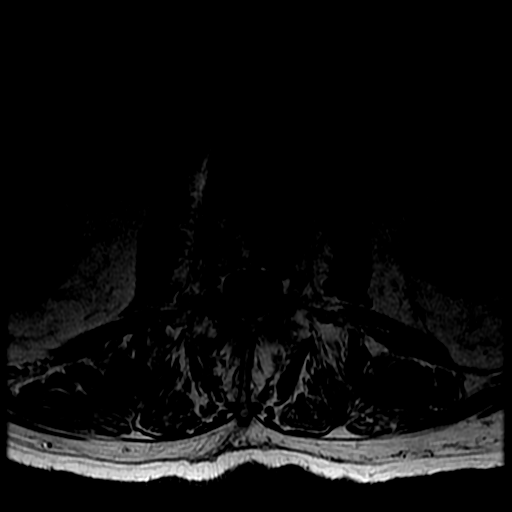

[Series 8: T2 · axial · 4.0mm · 0.39mm/px · z∈[-1,+128]mm · 6 of 28 slices shown (2 of 2)]
[im 1/28]
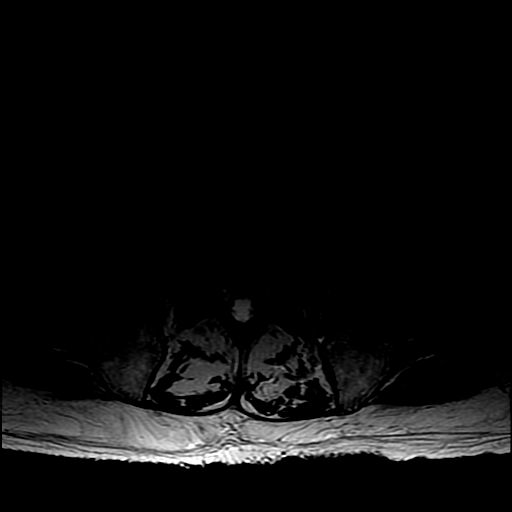
[im 5/28]
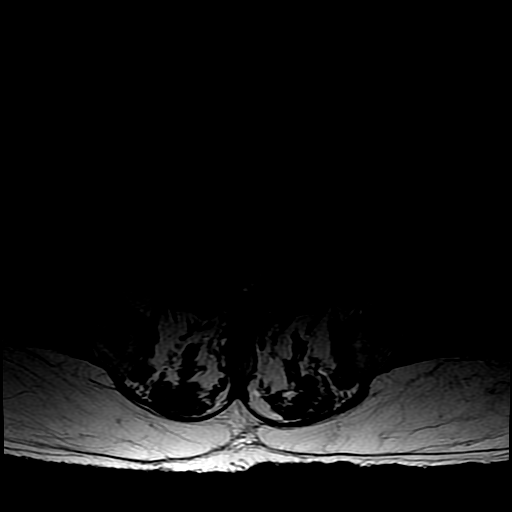
[im 9/28]
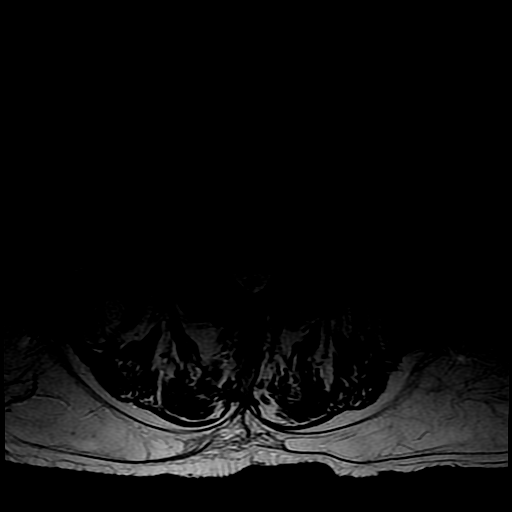
[im 13/28]
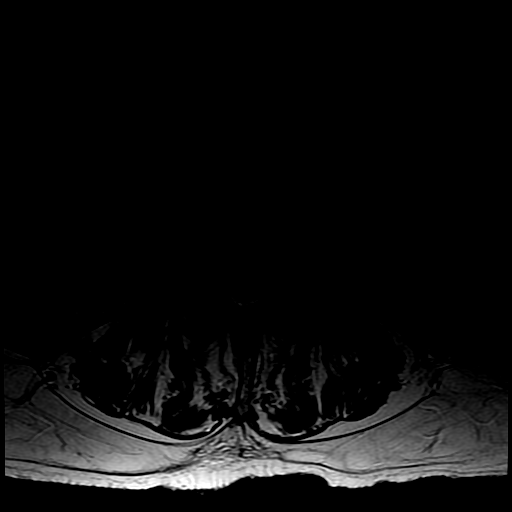
[im 15/28]
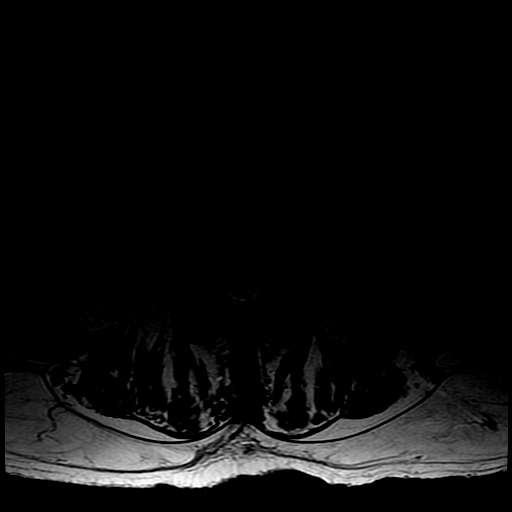
[im 23/28]
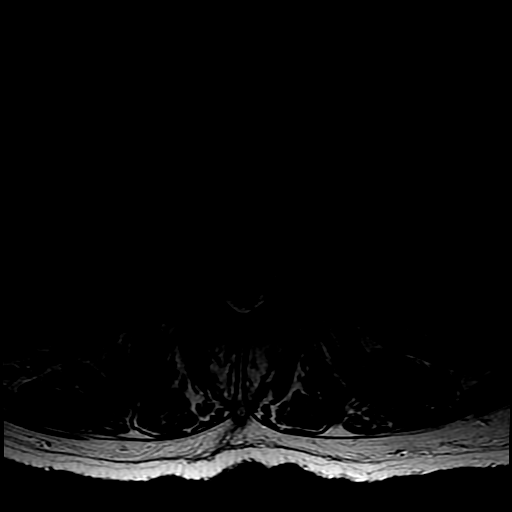

[19 of 48 positions shown; findings below may reference images not displayed]

FINDINGS: Artifact overlying the spine from aortic iliac stent graft. Artifact
most severe overlying the L1 vertebral body.

Segmentation:  Normal

Alignment:  Mild retrolisthesis L1-2, L2-3, L3-4.

Vertebrae: Negative for fracture or mass. L1 not well evaluated due
to artifact.

Conus medullaris and cauda equina: Conus extends to the T12-L1
level. Conus and cauda equina appear normal.

Paraspinal and other soft tissues: Large right renal cyst. Artifact
overlying the retroperitoneum without mass or adenopathy detected.

Disc levels:

T12-L1: Moderate to advanced disc degeneration and spurring.
Bilateral facet hypertrophy. Moderate spinal stenosis. Moderate
foraminal stenosis bilaterally

L2-3: Moderate to advanced disc degeneration and spurring. Bilateral
facet hypertrophy. Mild spinal stenosis. Mild subarticular stenosis
bilaterally

L3-4: Advanced disc degeneration with diffuse spurring left greater
than right. Bilateral facet hypertrophy. Moderate spinal stenosis.
Subarticular stenosis bilaterally left greater than right

L4-5: Moderate disc degeneration and spurring. Bilateral facet
hypertrophy. Mild spinal stenosis.

L5-S1: Moderate disc degeneration and spondylosis with diffuse
endplate spurring. Bilateral facet hypertrophy. Marked subarticular
and foraminal encroachment bilaterally. Mild spinal stenosis.
IMPRESSION: Image quality degraded by artifact from aorta stent graft.

Multilevel disc and facet degeneration throughout the lumbar spine
due to spondylosis. No acute disc protrusion. See above detail.

## 2020-02-18 ENCOUNTER — Other Ambulatory Visit: Payer: Self-pay | Admitting: Internal Medicine

## 2020-02-18 DIAGNOSIS — R109 Unspecified abdominal pain: Secondary | ICD-10-CM

## 2020-02-22 ENCOUNTER — Ambulatory Visit
Admission: RE | Admit: 2020-02-22 | Discharge: 2020-02-22 | Disposition: A | Discharge status: Home / Self Care | Payer: Medicare Other | Source: Ambulatory Visit | Attending: Internal Medicine | Admitting: Internal Medicine

## 2020-02-22 ENCOUNTER — Other Ambulatory Visit: Payer: Self-pay

## 2020-02-22 DIAGNOSIS — R109 Unspecified abdominal pain: Secondary | ICD-10-CM | POA: Diagnosis present

## 2020-02-29 ENCOUNTER — Other Ambulatory Visit: Payer: Self-pay | Admitting: General Surgery

## 2020-02-29 DIAGNOSIS — K8020 Calculus of gallbladder without cholecystitis without obstruction: Secondary | ICD-10-CM

## 2020-03-09 ENCOUNTER — Ambulatory Visit
Admission: RE | Admit: 2020-03-09 | Discharge: 2020-03-09 | Disposition: A | Discharge status: Home / Self Care | Payer: Medicare Other | Source: Ambulatory Visit | Attending: General Surgery | Admitting: General Surgery

## 2020-03-09 ENCOUNTER — Other Ambulatory Visit: Payer: Self-pay

## 2020-03-09 DIAGNOSIS — K8020 Calculus of gallbladder without cholecystitis without obstruction: Secondary | ICD-10-CM | POA: Diagnosis present

## 2020-03-09 MED ORDER — TECHNETIUM TC 99M MEBROFENIN IV KIT
5.0000 | PACK | Freq: Once | INTRAVENOUS | Status: AC | PRN
  Administered 2020-03-09: 5.3 via INTRAVENOUS

## 2021-04-25 ENCOUNTER — Encounter (INDEPENDENT_AMBULATORY_CARE_PROVIDER_SITE_OTHER): Payer: Self-pay | Admitting: Nurse Practitioner

## 2021-04-25 ENCOUNTER — Other Ambulatory Visit (INDEPENDENT_AMBULATORY_CARE_PROVIDER_SITE_OTHER): Payer: Self-pay | Admitting: Podiatry

## 2021-04-25 ENCOUNTER — Other Ambulatory Visit: Payer: Self-pay

## 2021-04-25 ENCOUNTER — Ambulatory Visit (INDEPENDENT_AMBULATORY_CARE_PROVIDER_SITE_OTHER): Payer: Medicare Other

## 2021-04-25 ENCOUNTER — Ambulatory Visit (INDEPENDENT_AMBULATORY_CARE_PROVIDER_SITE_OTHER): Payer: Medicare Other | Admitting: Nurse Practitioner

## 2021-04-25 VITALS — BP 105/66 | HR 83 | Resp 15 | Ht 71.0 in | Wt 175.0 lb

## 2021-04-25 DIAGNOSIS — I739 Peripheral vascular disease, unspecified: Secondary | ICD-10-CM

## 2021-05-06 ENCOUNTER — Encounter (INDEPENDENT_AMBULATORY_CARE_PROVIDER_SITE_OTHER): Payer: Self-pay | Admitting: Nurse Practitioner

## 2021-05-06 NOTE — Progress Notes (Signed)
Subjective:    Patient ID: Christian Clines, MD, male    DOB: 08-05-1930, 88 y.o.   MRN: [id number] No chief complaint on file.   Christian Silva is an 88 year old male that presents today as a referral from Dr. Luana Shu with concern for wound healing prior to an upcoming procedure for an ingrown toenail.  The patient notably has a history of an abdominal aortic aneurysm repair as well as a femorofemoral bypass.  He currently denies any claudication-like symptoms.  He denies any open wounds or ulcerations.  He denies any rest pain.  Currently the patient has noncompressible ABIs bilaterally.  He has a TBI 0.78 on the right and 0.90 on the left.  He has biphasic/triphasic tibial artery waveforms bilaterally.  He has normal toe waveforms bilaterally.   Review of Systems  Musculoskeletal:  Positive for gait problem.  All other systems reviewed and are negative.     Objective:   Physical Exam Vitals reviewed.  HENT:     Head: Normocephalic.  Cardiovascular:     Rate and Rhythm: Normal rate.     Pulses:          Dorsalis pedis pulses are 1+ on the right side and 1+ on the left side.       Posterior tibial pulses are 1+ on the right side and 1+ on the left side.  Pulmonary:     Effort: Pulmonary effort is normal.  Skin:    General: Skin is warm and dry.  Neurological:     Mental Status: He is alert and oriented to person, place, and time.  Psychiatric:        Mood and Affect: Mood normal.        Behavior: Behavior normal.        Thought Content: Thought content normal.        Judgment: Judgment normal.    BP 105/66 (BP Location: Left Arm)    Pulse 83    Resp 15    Ht '5\' 11"'$  (1.803 m)    Wt 175 lb (79.4 kg)    BMI 24.41 kg/m   Past Medical History:  Diagnosis Date   Atrial fibrillation (HCC)    BPH (benign prostatic hyperplasia)    CHF (congestive heart failure) (HCC)    1999   Chronic kidney disease    Kidney stones in past   Coronary artery disease    Dysrhythmia    Gout     Hyperlipidemia    Hypertension     Social History   Socioeconomic History   Marital status: Widowed    Spouse name: Not on file   Number of children: Not on file   Years of education: Not on file   Highest education level: Not on file  Occupational History   Not on file  Tobacco Use   Smoking status: Former    Types: Pipe   Smokeless tobacco: Former  Substance and Sexual Activity   Alcohol use: No   Drug use: No   Sexual activity: Not on file  Other Topics Concern   Not on file  Social History Narrative   Not on file   Social Determinants of Health   Financial Resource Strain: Not on file  Food Insecurity: Not on file  Transportation Needs: Not on file  Physical Activity: Not on file  Stress: Not on file  Social Connections: Not on file  Intimate Partner Violence: Not on file    Past Surgical History:  Procedure  Laterality Date   ABDOMINAL AORTIC ANEURYSM REPAIR  2000   [facility]   CARDIAC CATHETERIZATION     CARPAL TUNNEL RELEASE Right 02/25/2015   Procedure: CARPAL TUNNEL RELEASE;  Surgeon: Christophe Louis, MD;  Location: [facility] ORS;  Service: Orthopedics;  Laterality: Right;   CATARACT EXTRACTION W/PHACO Right 11/19/2017   Procedure: CATARACT EXTRACTION PHACO AND INTRAOCULAR LENS PLACEMENT ([facility]);  Surgeon: Birder Robson, MD;  Location: [facility] ORS;  Service: Ophthalmology;  Laterality: Right;  [place] 00:48 AP% 13.7 CDE 6.61 Fluid pack lot # [id number]   CATARACT EXTRACTION W/PHACO Left 12/17/2017   Procedure: CATARACT EXTRACTION PHACO AND INTRAOCULAR LENS PLACEMENT ([facility]);  Surgeon: Birder Robson, MD;  Location: [facility] ORS;  Service: Ophthalmology;  Laterality: Left;  [place] 00:44 CDE 6.11 Fluid pack lot # [id number]   CORONARY ANGIOPLASTY  2000   STENT   FEMORAL-FEMORAL BYPASS GRAFT  2002   [facility]   FRACTURE SURGERY Left    ANKLE   HERNIA REPAIR     Umbilical Hernia Repair, Dr. Raylene Everts   ILIAC ARTERY STENT  2014   [place]    Family  History  Problem Relation Age of Onset   Stroke Mother    Heart disease Mother    Heart disease Father    Prostate cancer Father     No Known Allergies  CBC Latest Ref Rng & Units 02/17/2015  WBC 3.8 - 10.6 K/uL 6.1  Hemoglobin 13.0 - 18.0 g/dL 14.0  Hematocrit 40.0 - 52.0 % 43.1  Platelets 150 - 440 K/uL 140(L)      CMP     Component Value Date/Time   K 3.9 02/17/2015 1358   CREATININE 1.30 (H) 05/18/2016 1314     VAS [place] ABI WITH/WO TBI  Result Date: 04/25/2021  [facility] STUDY Patient Name:  Christian Silva  Date of Exam:   04/25/2021 Medical Rec #: [id number]      Accession #:    [id number] Date of Birth: 1930/06/02       Patient Gender: M Patient Age:   48 years Exam Location:  [facility] Procedure:      VAS [place] ABI WITH/WO TBI Referring Phys: --------------------------------------------------------------------------------  Indications: Peripheral artery disease.  Vascular Interventions: Lt to Rt FemFem many years ago. Performing Technologist: Concha Norway RVT  Examination Guidelines: A complete evaluation includes at minimum, Doppler waveform signals and systolic blood pressure reading at the level of bilateral brachial, anterior tibial, and posterior tibial arteries, when vessel segments are accessible. Bilateral testing is considered an integral part of a complete examination. Photoelectric Plethysmograph (PPG) waveforms and toe systolic pressure readings are included as required and additional duplex testing as needed. Limited examinations for reoccurring indications may be performed as noted.  ABI Findings: +---------+------------------+-----+---------+--------+  Right     Rt Pressure (mmHg) Index Waveform  Comment   +---------+------------------+-----+---------+--------+  Brachial  115                                          +---------+------------------+-----+---------+--------+  ATA       152                1.32  biphasic             +---------+------------------+-----+---------+--------+  PTA       171  1.49  triphasic           +---------+------------------+-----+---------+--------+  Great Toe 90                 0.78  Normal              +---------+------------------+-----+---------+--------+ +---------+------------------+-----+---------+-------+  Left      Lt Pressure (mmHg) Index Waveform  Comment  +---------+------------------+-----+---------+-------+  Brachial  113                                         +---------+------------------+-----+---------+-------+  ATA       152                1.32  biphasic           +---------+------------------+-----+---------+-------+  PTA       170                1.48  triphasic          +---------+------------------+-----+---------+-------+  Great Toe 103                0.90                     +---------+------------------+-----+---------+-------+  Summary: Right: Resting right ankle-brachial index is within normal range. No evidence of significant right lower extremity arterial disease. The right toe-brachial index is normal. Left: Resting left ankle-brachial index is within normal range. No evidence of significant left lower extremity arterial disease. The left toe-brachial index is normal.  *See table(s) above for measurements and observations.  Electronically signed by Leotis Pain MD on 04/25/2021 at 3:52:12 PM.    Final        Assessment & Plan:   1. PAD (peripheral artery disease) ([place]) Today the patient has an noncompressible ABIs but a TBI of 0.78 on the right and 0.90 on the left.  The waveforms are normal to the toes.  He should have no issues with healing following an ingrown toenail removal.  The patient also notes that he follows with [facility] regularly for his other previous vascular interventions.  Patient will continue to follow with [facility].   Current Outpatient Medications on File Prior to Visit  Medication Sig Dispense Refill   acetaminophen (TYLENOL) 500 MG tablet Take  500-1,000 mg by mouth daily as needed for moderate pain or headache.     allopurinol (ZYLOPRIM) 300 MG tablet Take 300 mg by mouth at bedtime.     aspirin 81 MG tablet Take 81 mg by mouth at bedtime.     chlorthalidone (HYGROTON) 25 MG tablet Take 12.5 mg by mouth daily.     diltiazem (CARDIZEM CD) 240 MG 24 hr capsule Take 240 mg by mouth at bedtime.     finasteride (PROSCAR) 5 MG tablet Take 5 mg by mouth at bedtime.     lisinopril (PRINIVIL,ZESTRIL) 40 MG tablet Take 40 mg by mouth daily.     Melatonin 5 MG CAPS Take 10-20 mg by mouth at bedtime as needed (sleep).     metoprolol (LOPRESSOR) 50 MG tablet Take 50 mg by mouth 2 (two) times daily.     Omega-3 Fatty Acids (FISH OIL) 1200 MG CAPS Take 2,400 mg by mouth at bedtime.     rosuvastatin (CRESTOR) 20 MG tablet Take 20 mg by mouth daily.     tamsulosin (FLOMAX) 0.4 MG CAPS capsule Take  0.4 mg by mouth at bedtime.     warfarin (COUMADIN) 5 MG tablet Take 5-7.5 mg by mouth daily. Take 7.5 mg at bedtime on Mon, Wed, and Fri. Take 5 mg at bedtime on Tues, Thurs, Sat, Sun     No current facility-administered medications on file prior to visit.    There are no Patient Instructions on file for this visit. No follow-ups on file.   Kris Hartmann, NP

## 2022-02-25 ENCOUNTER — Emergency Department: Payer: Medicare Other

## 2022-02-25 ENCOUNTER — Other Ambulatory Visit: Payer: Self-pay

## 2022-02-25 ENCOUNTER — Emergency Department
Admission: EM | Admit: 2022-02-25 | Discharge: 2022-02-25 | Disposition: A | Discharge status: Home / Self Care | Payer: Medicare Other | Attending: Emergency Medicine | Admitting: Emergency Medicine

## 2022-02-25 DIAGNOSIS — I6782 Cerebral ischemia: Secondary | ICD-10-CM | POA: Diagnosis not present

## 2022-02-25 DIAGNOSIS — R42 Dizziness and giddiness: Secondary | ICD-10-CM | POA: Diagnosis not present

## 2022-02-25 DIAGNOSIS — I482 Chronic atrial fibrillation, unspecified: Secondary | ICD-10-CM | POA: Insufficient documentation

## 2022-02-25 DIAGNOSIS — Z7901 Long term (current) use of anticoagulants: Secondary | ICD-10-CM | POA: Diagnosis not present

## 2022-02-25 DIAGNOSIS — I509 Heart failure, unspecified: Secondary | ICD-10-CM | POA: Insufficient documentation

## 2022-02-25 DIAGNOSIS — N189 Chronic kidney disease, unspecified: Secondary | ICD-10-CM | POA: Insufficient documentation

## 2022-02-25 LAB — PROTIME-INR
INR: 2 — ABNORMAL HIGH (ref 0.8–1.2)
Prothrombin Time: 22.8 seconds — ABNORMAL HIGH (ref 11.4–15.2)

## 2022-02-25 LAB — BASIC METABOLIC PANEL
Anion gap: 8 (ref 5–15)
BUN: 29 mg/dL — ABNORMAL HIGH (ref 8–23)
CO2: 25 mmol/L (ref 22–32)
Calcium: 9 mg/dL (ref 8.9–10.3)
Chloride: 109 mmol/L (ref 98–111)
Creatinine, Ser: 1.14 mg/dL (ref 0.61–1.24)
GFR, Estimated: >60 mL/min (ref >60)
Glucose, Bld: 153 mg/dL — ABNORMAL HIGH (ref 70–99)
Potassium: 4 mmol/L (ref 3.5–5.1)
Sodium: 142 mmol/L (ref 135–145)

## 2022-02-25 LAB — CBC WITH DIFFERENTIAL/PLATELET
Abs Immature Granulocytes: 0.02 10*3/uL (ref 0.00–0.07)
Basophils Absolute: 0 10*3/uL (ref 0.0–0.1)
Basophils Relative: 0 %
Eosinophils Absolute: 0.3 10*3/uL (ref 0.0–0.5)
Eosinophils Relative: 5 %
HCT: 41.5 % (ref 39.0–52.0)
Hemoglobin: 13.2 g/dL (ref 13.0–17.0)
Immature Granulocytes: 0 %
Lymphocytes Relative: 11 %
Lymphs Abs: 0.7 10*3/uL (ref 0.7–4.0)
MCH: 31 pg (ref 26.0–34.0)
MCHC: 31.8 g/dL (ref 30.0–36.0)
MCV: 97.4 fL (ref 80.0–100.0)
Monocytes Absolute: 0.5 10*3/uL (ref 0.1–1.0)
Monocytes Relative: 8 %
Neutro Abs: 4.6 10*3/uL (ref 1.7–7.7)
Neutrophils Relative %: 76 %
Platelets: 144 10*3/uL — ABNORMAL LOW (ref 150–400)
RBC: 4.26 MIL/uL (ref 4.22–5.81)
RDW: 14.5 % (ref 11.5–15.5)
WBC: 6.1 10*3/uL (ref 4.0–10.5)
nRBC: 0 % (ref 0.0–0.2)

## 2022-02-25 LAB — TROPONIN I (HIGH SENSITIVITY): Troponin I (High Sensitivity): 8 ng/L (ref <18)

## 2022-02-25 NOTE — Discharge Instructions (Signed)
Your lab tests and MRI of the brain were all okay today.  Continue taking your medications, and follow-up with neurology as scheduled.

## 2022-02-25 NOTE — ED Provider Notes (Signed)
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  He reports that over the past several weeks he has been feeling somewhat unsteady.  His doctor has referred him to see neurology and he has an upcoming appointment.  This morning, he felt that the unsteadiness was somewhat worse, and after eating a bowl of cereal for breakfast, he had turned to go to the living room and felt a sudden onset of room spinning and feeling off balance, causing him to fall over onto a chair.  He denies any injury.  Did not hit his head.  Denies pain.  No chest pain or shortness of breath.  The episode lasted about 5 minutes and then resolved.  He currently feels back to baseline without any acute symptoms.     Physical Exam   Triage Vital Signs: ED Triage Vitals  Enc Vitals Group     BP 02/25/22 0828 136/75     Pulse Rate 02/25/22 0828 (!) 101     Resp 02/25/22 0828 20     Temp 02/25/22 0828 97.7 F (36.5 C)     Temp Source 02/25/22 0828 Oral     SpO2 02/25/22 0828 97 %     Weight 02/25/22 0830 170 lb (77.1 kg)     Height 02/25/22 0830 6' (1.829 m)     Head Circumference --      Peak Flow --      Pain Score 02/25/22 0828 0     Pain Loc --      Pain Edu? --      Excl. in [place]? --     Most recent vital signs: Vitals:   02/25/22 0828  BP: 136/75  Pulse: (!) 101  Resp: 20  Temp: 97.7 F (36.5 C)  SpO2: 97%    General: Awake, no distress.  CV:  Good peripheral perfusion. Regular rate, normal pulses Resp:  Normal effort. ctab Abd:  No distention. Soft, nontender Other:  No LE edema. Moist oral mucosa   ED Results / Procedures / Treatments    Labs (all labs ordered are listed, but only abnormal results are displayed) Labs Reviewed  BASIC METABOLIC PANEL - Abnormal; Notable for the following components:      Result Value   Glucose, Bld 153 (*)    BUN 29 (*)    All other components within normal limits  CBC WITH DIFFERENTIAL/PLATELET - Abnormal; Notable for the following components:   Platelets 144 (*)    All other components within normal limits  PROTIME-INR - Abnormal; Notable for the following components:   Prothrombin Time 22.8 (*)    INR 2.0 (*)    All other components within normal limits  TROPONIN I (HIGH SENSITIVITY)     EKG Interpreted by me Atrial fibrillation, rate of 91.  Left axis, normal intervals.  Poor R wave progression.  Normal ST segments and T waves.   RADIOLOGY MRI brain interpreted by me, negative for mass or infarct.  Radiology report reviewed.  MRA head negative.   PROCEDURES:  Procedures   MEDICATIONS ORDERED IN ED: Medications -  No data to display   IMPRESSION / MDM / [place] / ED COURSE  I reviewed the triage vital signs and the nursing notes.                              Differential diagnosis includes, but is not limited to, BPPV, dehydration, anemia, vertebrobasilar insufficiency, cerebellar CVA  Patient's presentation is most consistent with acute presentation with potential threat to life or bodily function.  Pt with afib p/w unsteadiness and episode of vertigo sx causing loss of balance and fall.  No significant traumatic injury.  Sx most c/w BPPV. Will obtain labs and MRI brain due to age, a-fib, coumadin use.   ----------------------------------------- 9:47 AM on 02/25/2022 ----------------------------------------- MRI/MRI negative.  Labs unremarkable.  INR 2.0.  Hemoglobin 13.  Patient is nontoxic.  Doubt cardiovascular etiology.  Stable for discharge      FINAL CLINICAL IMPRESSION(S) / ED DIAGNOSES   Final diagnoses:  Vertigo  Chronic atrial  fibrillation ([place])     Rx / DC Orders   ED Discharge Orders     None        Note:  This document was prepared using Dragon voice recognition software and may include unintentional dictation errors.   Carrie Mew, MD 02/25/22 [phone]

## 2022-02-25 NOTE — ED Triage Notes (Signed)
Pty to ED from home [facility] for fall out of chair when suddenly became dizzy this AM. Pt states has cardiac hx, hx a fib,  and hx aortic aneurysm repair. States dizziness comes and goes. Takes coumadin. Denies head trauma. Alert, oriented. EDP at bedside.

## 2022-06-22 IMAGING — US US ABDOMEN COMPLETE
1 series · 13 of 25 positions shown · non-contrast
Comparison: CT abdomen 09/05/2012

CLINICAL DATA: Abdominal pain for multiple months.

EXAM:
ABDOMEN ULTRASOUND COMPLETE

[Series 1: us abdomen complete · 13 of 106 slices shown]
[im 1/106]
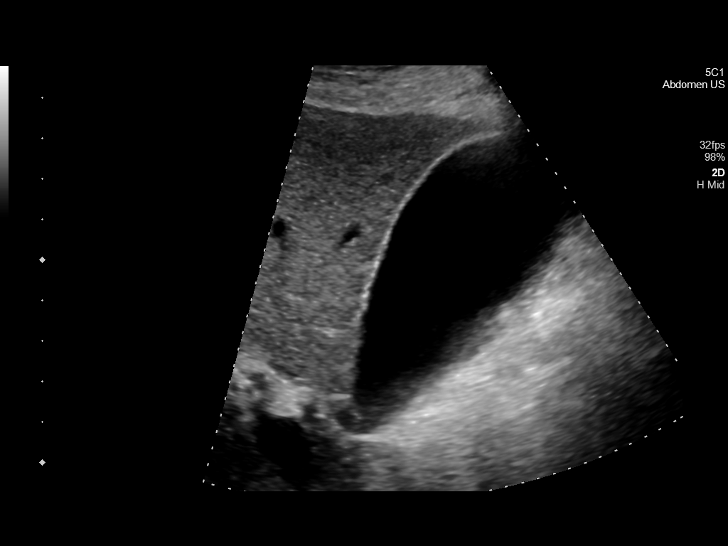
[im 9/106]
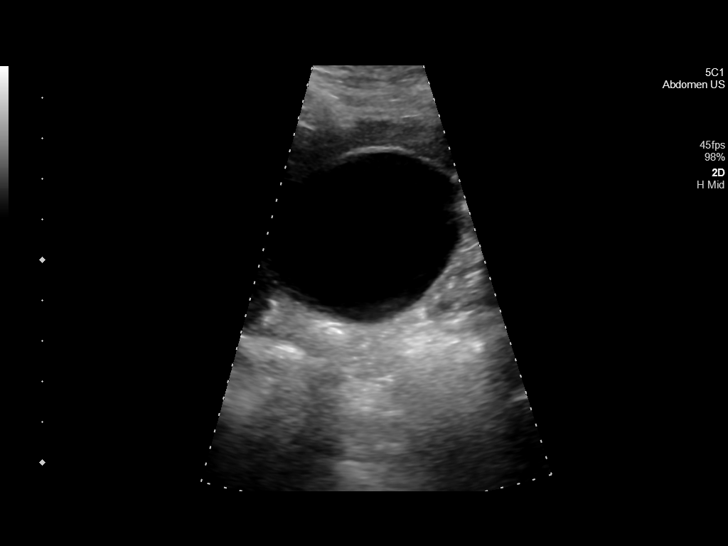
[im 18/106]
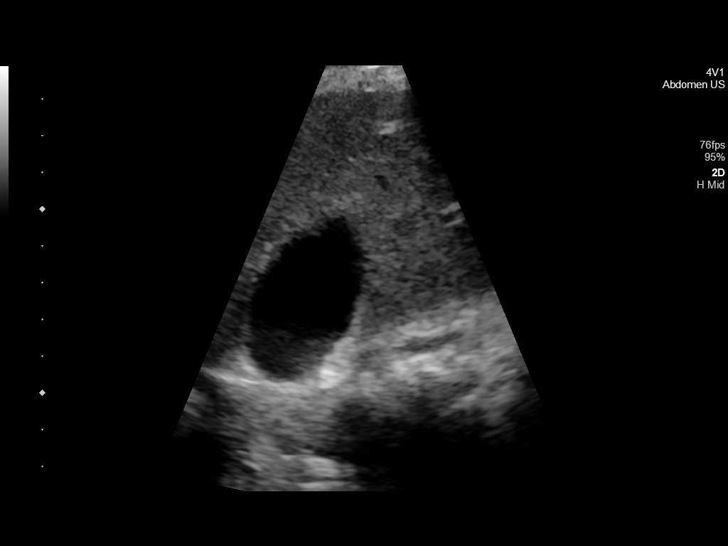
[im 27/106]
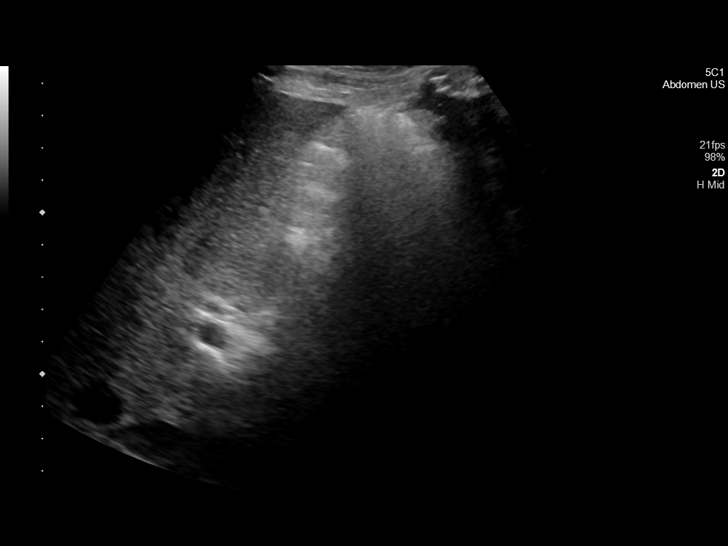
[im 36/106]
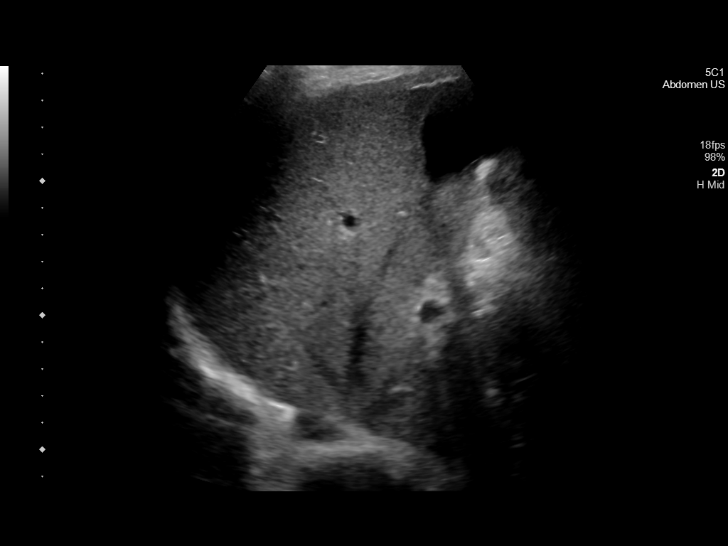
[im 44/106]
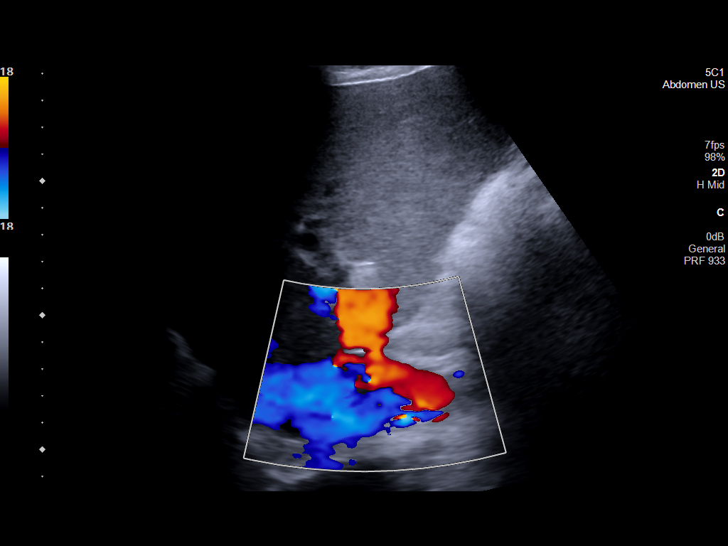
[im 53/106]
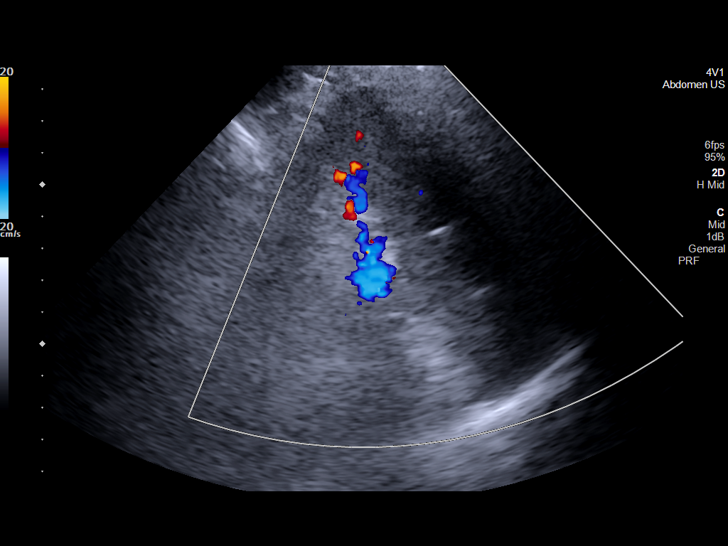
[im 62/106]
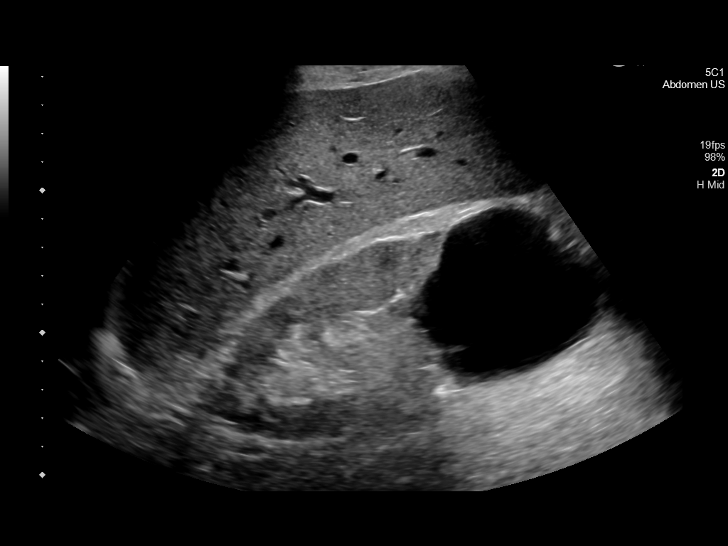
[im 71/106]
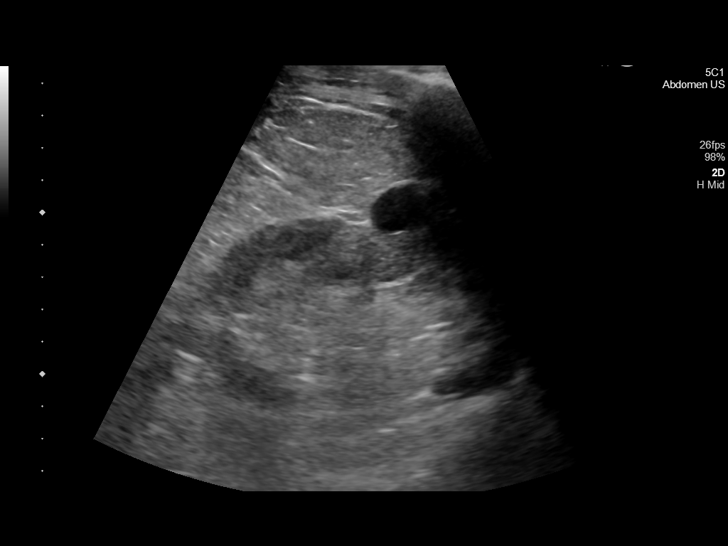
[im 79/106]
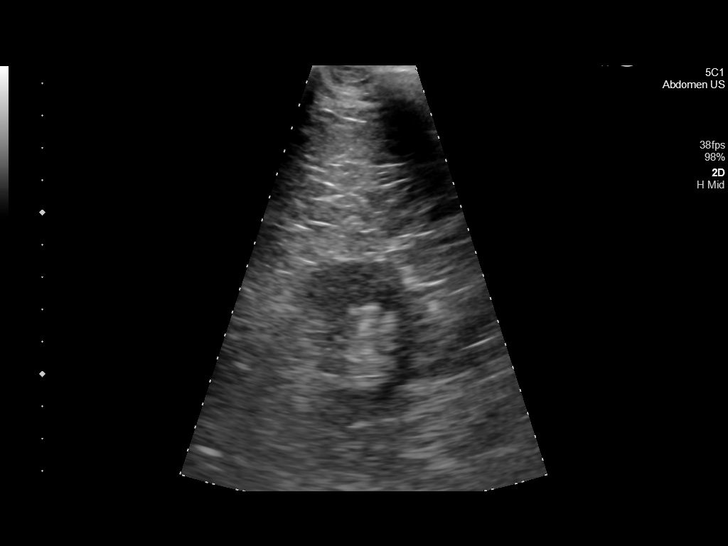
[im 88/106]
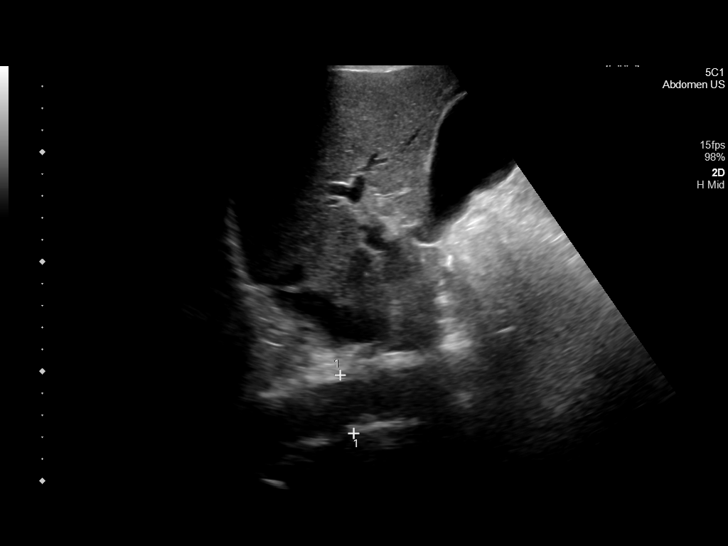
[im 97/106]
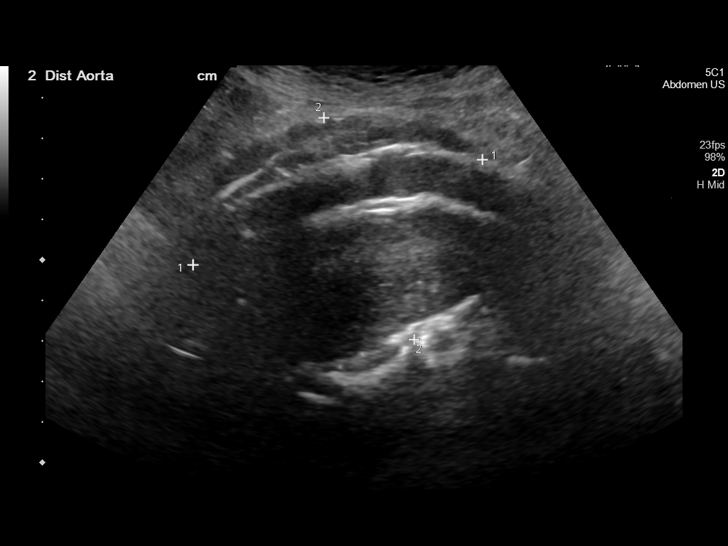
[im 106/106]
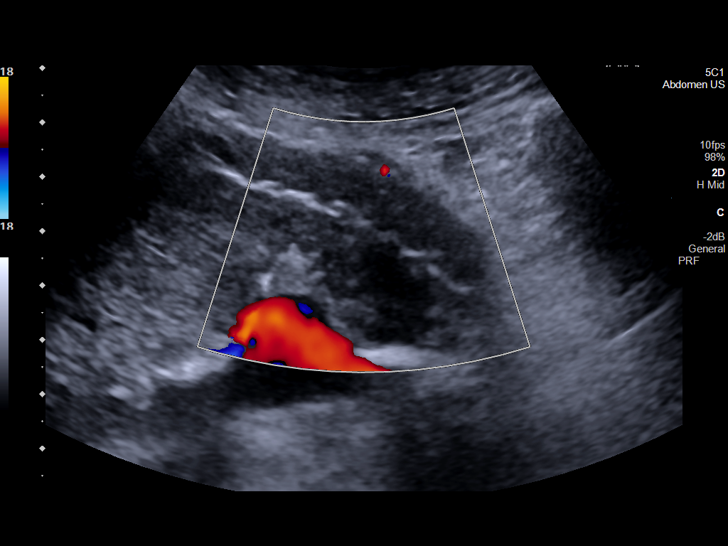

[13 of 25 positions shown; findings below may reference images not displayed]

FINDINGS: Gallbladder: Mobile calculi measuring up to 0.6 cm in diameter. No
sonographic Murphy sign noted by sonographer.

Common bile duct: Diameter: 0.4 cm

Liver: Poor visualization of the left hepatic lobe due to bowel gas.
No focal lesion identified. Within normal limits in parenchymal
echogenicity. Portal vein is patent on color Doppler imaging with
normal direction of blood flow towards the liver.

IVC: No abnormality visualized.

Pancreas: Not well seen due to overlying bowel gas.

Spleen: Size and appearance within normal limits.

Right Kidney: Length: 12.4 cm. Echogenicity within normal limits. No
hydronephrosis. 7.2 by 7.3 by 7.4 cm right kidney lower pole cystic
lesion has some internal echoes and mild internal septation
indicating complexity, and previously measured 6.5 by 5.7 by 6.0 cm.

Left Kidney: Length: 10.6 cm. Echogenicity within normal limits. No
hydronephrosis. Exophytic lesion of the left mid kidney measures
by 1.3 by 1.6 cm and appears simple.

Abdominal aorta: Abdominal aortic aneurysm about 5.9 cm in diameter,
with probable endoleak posterior to the graft within the aneurysm
shown on image 99. Occlusion of the right iliac artery graft branch,
as shown on 09/05/2012 (at that time, the patient also demonstrated
a patent femoral-femoral graft).

Other findings: None.
IMPRESSION: 1. Small gallstones noted. No CBD dilatation. No gallbladder wall
thickening or pericholecystic fluid.
2. Over the last 7 years there has been mild enlargement of the
right kidney lower pole Bosniak category 2 cyst, currently measuring
up to 7.4 cm in diameter. No visible internal nodularity.
3. Continued infrarenal abdominal aortic aneurysm about 5.9 cm in
diameter, with probable endoleak posterior to the aorta bi-iliac
graft. Chronic occlusion of the right iliac artery graft branch
(previously in 8573 this was also present, with retrograde
reconstitution due to a patent femoral-femoral graft at that time).
4. Nonvisualization of the pancreas and parts of the left hepatic
lobe due to overlying bowel gas.

## 2022-08-08 ENCOUNTER — Other Ambulatory Visit: Payer: Self-pay

## 2022-08-08 ENCOUNTER — Emergency Department: Payer: Medicare Other

## 2022-08-08 ENCOUNTER — Emergency Department
Admission: EM | Admit: 2022-08-08 | Discharge: 2022-08-08 | Disposition: A | Discharge status: Home / Self Care | Payer: Medicare Other | Attending: Student in an Organized Health Care Education/Training Program | Admitting: Student in an Organized Health Care Education/Training Program

## 2022-08-08 ENCOUNTER — Encounter: Payer: Self-pay | Admitting: Emergency Medicine

## 2022-08-08 DIAGNOSIS — R918 Other nonspecific abnormal finding of lung field: Secondary | ICD-10-CM | POA: Diagnosis not present

## 2022-08-08 DIAGNOSIS — Z91148 Patient's other noncompliance with medication regimen for other reason: Secondary | ICD-10-CM | POA: Diagnosis not present

## 2022-08-08 DIAGNOSIS — R531 Weakness: Secondary | ICD-10-CM | POA: Diagnosis not present

## 2022-08-08 DIAGNOSIS — I4891 Unspecified atrial fibrillation: Secondary | ICD-10-CM | POA: Insufficient documentation

## 2022-08-08 DIAGNOSIS — R002 Palpitations: Secondary | ICD-10-CM | POA: Diagnosis present

## 2022-08-08 DIAGNOSIS — I700 Atherosclerosis of aorta: Secondary | ICD-10-CM | POA: Diagnosis not present

## 2022-08-08 LAB — COMPREHENSIVE METABOLIC PANEL
ALT: 26 U/L (ref 0–44)
AST: 35 U/L (ref 15–41)
Albumin: 3.7 g/dL (ref 3.5–5.0)
Alkaline Phosphatase: 67 U/L (ref 38–126)
Anion gap: 8 (ref 5–15)
BUN: 26 mg/dL — ABNORMAL HIGH (ref 8–23)
CO2: 25 mmol/L (ref 22–32)
Calcium: 8.2 mg/dL — ABNORMAL LOW (ref 8.9–10.3)
Chloride: 108 mmol/L (ref 98–111)
Creatinine, Ser: 1.24 mg/dL (ref 0.61–1.24)
GFR, Estimated: 56 mL/min — ABNORMAL LOW (ref >60)
Glucose, Bld: 119 mg/dL — ABNORMAL HIGH (ref 70–99)
Potassium: 4.9 mmol/L (ref 3.5–5.1)
Sodium: 141 mmol/L (ref 135–145)
Total Bilirubin: 0.8 mg/dL (ref 0.3–1.2)
Total Protein: 6.1 g/dL — ABNORMAL LOW (ref 6.5–8.1)

## 2022-08-08 LAB — BRAIN NATRIURETIC PEPTIDE: B Natriuretic Peptide: 628.3 pg/mL — ABNORMAL HIGH (ref 0.0–100.0)

## 2022-08-08 LAB — CBC
HCT: 44.8 % (ref 39.0–52.0)
Hemoglobin: 14.2 g/dL (ref 13.0–17.0)
MCH: 31.4 pg (ref 26.0–34.0)
MCHC: 31.7 g/dL (ref 30.0–36.0)
MCV: 99.1 fL (ref 80.0–100.0)
Platelets: 153 10*3/uL (ref 150–400)
RBC: 4.52 MIL/uL (ref 4.22–5.81)
RDW: 14.4 % (ref 11.5–15.5)
WBC: 7.5 10*3/uL (ref 4.0–10.5)
nRBC: 0 % (ref 0.0–0.2)

## 2022-08-08 LAB — PROTIME-INR
INR: 2.3 — ABNORMAL HIGH (ref 0.8–1.2)
Prothrombin Time: 25.2 seconds — ABNORMAL HIGH (ref 11.4–15.2)

## 2022-08-08 LAB — TROPONIN I (HIGH SENSITIVITY)
Troponin I (High Sensitivity): 23 ng/L — ABNORMAL HIGH (ref <18)
Troponin I (High Sensitivity): 21 ng/L — ABNORMAL HIGH (ref <18)

## 2022-08-08 MED ORDER — DILTIAZEM HCL 25 MG/5ML IV SOLN
10.0000 mg | Freq: Once | INTRAVENOUS | Status: AC
  Administered 2022-08-08: 10 mg via INTRAVENOUS
  Filled 2022-08-08: qty 5

## 2022-08-08 MED ORDER — METOPROLOL TARTRATE 5 MG/5ML IV SOLN
5.0000 mg | Freq: Once | INTRAVENOUS | Status: AC
  Administered 2022-08-08: 5 mg via INTRAVENOUS
  Filled 2022-08-08: qty 5

## 2022-08-08 MED ORDER — DILTIAZEM HCL ER COATED BEADS 240 MG PO CP24
240.0000 mg | ORAL_CAPSULE | Freq: Every day | ORAL | Status: DC
  Administered 2022-08-08: 240 mg via ORAL
  Filled 2022-08-08: qty 1

## 2022-08-08 MED ORDER — METOPROLOL TARTRATE 25 MG PO TABS
25.0000 mg | ORAL_TABLET | Freq: Once | ORAL | Status: AC
  Administered 2022-08-08: 25 mg via ORAL
  Filled 2022-08-08: qty 1

## 2022-08-08 MED ORDER — DILTIAZEM HCL ER COATED BEADS 240 MG PO CP24
240.0000 mg | ORAL_CAPSULE | Freq: Every day | ORAL | Status: DC
  Filled 2022-08-08: qty 1

## 2022-08-08 MED ORDER — SODIUM CHLORIDE 0.9 % IV BOLUS
500.0000 mL | Freq: Once | INTRAVENOUS | Status: AC
  Administered 2022-08-08: 500 mL via INTRAVENOUS

## 2022-08-08 NOTE — ED Triage Notes (Signed)
Pt arrived via [facility] from home, with reports of feeling bad for a couple of months, pt has hx of afib, stopped taking BP meds and metoprolol for 3-4 days and thought he was feeling better, but then started feeling bad.  Pt c/o heart racing and took metoprolol around 0200 this morning.   EKG with EMS shows afib- p 127 152/97 96% RA

## 2022-08-08 NOTE — ED Triage Notes (Signed)
Pt to ED via [facility] from home. Pt states that he has not been feeling well for the past few months. Pt states that it is hard to describe how he feels. Reports feeling lightheaded when he stands and also very tired. Pt is very pale, pt states that he is unsure if this is his baseline color. Pt has hx/o A-fib. Pt states that he stopped taking his blood pressure medication for a few days because he thought that it may be causing him to feel bad. Pt is currently in A.fib with a rate between 42-127.

## 2022-08-08 NOTE — Discharge Instructions (Signed)
Please take your diltiazem and metoprolol as prescribed.  You can try a few days of holding your HCTZ to see if this helps your symptoms.  Please follow-up with cardiology.

## 2022-08-08 NOTE — ED Provider Notes (Signed)
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  States has been feeling weak and generalized malaise for several weeks now thought is related to his blood pressure medications so he stopped taking them and then started feeling he was having increasing palpitations and his A-fib was acting up.  Did take his evening metoprolol last night.  Has not taken his Cardizem in several days.  Denies any orthopnea.  No shortness of breath at this time.  No nausea or vomiting or diarrhea.  No melena no hematochezia.     Physical Exam   Triage Vital Signs: ED Triage Vitals  Enc Vitals Group     BP 08/08/22 0710 (!) 156/105     Pulse Rate 08/08/22 0710 (!) 129     Resp 08/08/22 0710 16     Temp 08/08/22 0717 97.6 F (36.4 C)     Temp Source 08/08/22 0717 Oral     SpO2 08/08/22 0710 98 %     Weight 08/08/22 0710 172 lb (78 kg)     Height 08/08/22 0710 5\' 11"  (1.803 m)     Head Circumference --      Peak Flow --      Pain Score 08/08/22 0710 0     Pain Loc --      Pain Edu? --      Excl. in [place]? --     Most recent vital signs: Vitals:   08/08/22 1200 08/08/22 1210  BP: (!) 123/90   Pulse:  96  Resp: 13 13  Temp:    SpO2:  99%     Constitutional: Alert  Eyes: Conjunctivae are normal.  Head: Atraumatic. Nose: No congestion/rhinnorhea. Mouth/Throat: Mucous membranes are moist.   Neck: Painless ROM.  Cardiovascular:   Tachycardic irregularly irregular rhythm overall good peripheral circulation. Respiratory: Normal respiratory effort.  No retractions.  Gastrointestinal: Soft and nontender.  Musculoskeletal:  no deformity Neurologic:  MAE spontaneously. No gross focal neurologic deficits are  appreciated.  Skin:  Skin is warm, dry and intact. No rash noted. Psychiatric: Mood and affect are normal. Speech and behavior are normal.    ED Results / Procedures / Treatments   Labs (all labs ordered are listed, but only abnormal results are displayed) Labs Reviewed  PROTIME-INR - Abnormal; Notable for the following components:      Result Value   Prothrombin Time 25.2 (*)    INR 2.3 (*)    All other components within normal limits  COMPREHENSIVE METABOLIC PANEL - Abnormal; Notable for the following components:   Glucose, Bld 119 (*)    BUN 26 (*)    Calcium 8.2 (*)    Total Protein 6.1 (*)    GFR, Estimated 56 (*)    All other components within normal limits  BRAIN NATRIURETIC PEPTIDE - Abnormal; Notable for the following components:   B Natriuretic Peptide 628.3 (*)    All other components within normal limits  TROPONIN I (HIGH SENSITIVITY) - Abnormal; Notable for the following components:   Troponin I (High Sensitivity) 23 (*)    All other components within normal limits  TROPONIN I (HIGH SENSITIVITY) - Abnormal; Notable for the following components:  Troponin I (High Sensitivity) 21 (*)    All other components within normal limits  CBC  URINALYSIS, ROUTINE W REFLEX MICROSCOPIC  CBG MONITORING, ED     EKG  ED ECG REPORT I, Willy Eddy, the attending physician, personally viewed and interpreted this ECG.   Date: 08/08/2022  EKG Time: 7:15  Rate: 134  Rhythm: afib with rvr  Axis: normal  Intervals: normal  ST&T Change: no stemi, nonspecific st abn    RADIOLOGY Please see ED Course for my review and interpretation.  I personally reviewed all radiographic images ordered to evaluate for the above acute complaints and reviewed radiology reports and findings.  These findings were personally discussed with the patient.  Please see medical record for radiology report.    PROCEDURES:  Critical Care performed: No  Procedures   MEDICATIONS ORDERED  IN ED: Medications  diltiazem (CARDIZEM CD) 24 hr capsule 240 mg (240 mg Oral Given 08/08/22 0903)  diltiazem (CARDIZEM) injection 10 mg (10 mg Intravenous Given 08/08/22 0752)  sodium chloride 0.9 % bolus 500 mL (0 mLs Intravenous Stopped 08/08/22 1048)  metoprolol tartrate (LOPRESSOR) injection 5 mg (5 mg Intravenous Given 08/08/22 1113)  metoprolol tartrate (LOPRESSOR) tablet 25 mg (25 mg Oral Given 08/08/22 1153)     IMPRESSION / MDM / ASSESSMENT AND PLAN / ED COURSE  I reviewed the triage vital signs and the nursing notes.                              Differential diagnosis includes, but is not limited to, afib, electrolyte abn, chf, pna, sepsis, acs  Patient presenting to the ER for evaluation of symptoms as described above.  Based on symptoms, risk factors and considered above differential, this presenting complaint could reflect a potentially life-threatening illness therefore the patient will be placed on continuous pulse oximetry and telemetry for monitoring.  Laboratory evaluation will be sent to evaluate for the above complaints.     Clinical Course as of 08/08/22 1244  Wed Aug 08, 2022  1610 Chest x-ray my review and interpretation without evidence of consolidation. [PR]  0827 Heart rate now improved. [PR]  0915 Patient felt significantly improved with heart rate control but blood pressure now downtrending will give bolus of IV fluid. [PR]  1216 Patient reassessed.  Heart rates have been in the 90s.  He has been rate controlled.  He feels significantly improved now that his heart rate is rate controlled.  I do suspect that he is just symptomatic from his A-fib having not taken his rate control medications.  He is not having any signs of ACS.  Otherwise well-appearing.  He is denying any shortness of breath.  Low suspicion for ILD or bronchiectasis.  Do not feel that CT imaging clinically in the emergency room at this point.  Does appear stable and appropriate for outpatient  follow-up. [PR]    Clinical Course User Index [PR] Willy Eddy, MD     FINAL CLINICAL IMPRESSION(S) / ED DIAGNOSES   Final diagnoses:  Atrial fibrillation with RVR (HCC)     Rx / DC Orders   ED Discharge Orders     None        Note:  This document was prepared using Dragon voice recognition software and may include unintentional dictation errors.    Willy Eddy, MD 08/08/22 1244

## 2023-01-07 ENCOUNTER — Ambulatory Visit (INDEPENDENT_AMBULATORY_CARE_PROVIDER_SITE_OTHER): Payer: Medicare Other | Admitting: Urology

## 2023-01-07 ENCOUNTER — Encounter: Payer: Self-pay | Admitting: Urology

## 2023-01-07 VITALS — BP 119/72 | HR 88 | Ht 71.0 in | Wt 178.0 lb

## 2023-01-07 DIAGNOSIS — R972 Elevated prostate specific antigen [PSA]: Secondary | ICD-10-CM

## 2023-01-07 DIAGNOSIS — N401 Enlarged prostate with lower urinary tract symptoms: Secondary | ICD-10-CM

## 2023-01-07 DIAGNOSIS — Z125 Encounter for screening for malignant neoplasm of prostate: Secondary | ICD-10-CM | POA: Diagnosis not present

## 2023-01-07 MED ORDER — TAMSULOSIN HCL 0.4 MG PO CAPS: 0.4000 mg | ORAL_CAPSULE | Freq: Every day | ORAL | 3 refills | Status: DC

## 2023-01-07 MED ORDER — FINASTERIDE 5 MG PO TABS: 5.0000 mg | ORAL_TABLET | Freq: Every day | ORAL | 3 refills | Status: DC

## 2023-01-07 NOTE — Progress Notes (Signed)
I, Maysun Anabel Bene, acting as a scribe for Riki Altes, MD., have documented all relevant documentation on the behalf of Riki Altes, MD, as directed by Riki Altes, MD while in the presence of Riki Altes, MD.  01/07/2023 5:32 PM   Christian Falconer, MD 1930-03-10 [id number]  Referring provider: Jaclyn Shaggy, MD [address]   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 He has been on tamsulosin/finasteride and presents for annual follow-up.  No bothersome LUTS.  Denies dysuria, gross hematuria.  No flank, abdominal, or pelvic pain.  PSA in mid 2 range since 2020.   PMH: Past Medical History:  Diagnosis Date   Atrial fibrillation (HCC)    BPH (benign prostatic hyperplasia)    CHF (congestive heart failure) (HCC)    1999   Chronic kidney disease    Kidney stones in past   Coronary artery disease    Dysrhythmia    Gout    Hyperlipidemia    Hypertension     Surgical History: Past Surgical History:  Procedure Laterality Date   ABDOMINAL AORTIC ANEURYSM REPAIR  2000   [facility]   CARDIAC CATHETERIZATION     CARPAL TUNNEL RELEASE Right 02/25/2015   Procedure: CARPAL TUNNEL RELEASE;  Surgeon: Myra Rude, MD;  Location: [facility] ORS;  Service: Orthopedics;  Laterality: Right;   CATARACT EXTRACTION W/PHACO Right 11/19/2017   Procedure: CATARACT EXTRACTION PHACO AND INTRAOCULAR LENS PLACEMENT (IOC);  Surgeon: Galen Manila, MD;  Location: [facility] ORS;  Service: Ophthalmology;  Laterality: Right;  [place] 00:48 AP% 13.7 CDE 6.61 Fluid pack lot # [id number]   CATARACT EXTRACTION W/PHACO Left 12/17/2017   Procedure: CATARACT EXTRACTION PHACO AND INTRAOCULAR LENS PLACEMENT (IOC);  Surgeon: Galen Manila, MD;  Location: [facility] ORS;  Service: Ophthalmology;  Laterality: Left;   [place] 00:44 CDE 6.11 Fluid pack lot # [id number]   CORONARY ANGIOPLASTY  2000   STENT   FEMORAL-FEMORAL BYPASS GRAFT  2002   [facility]   FRACTURE SURGERY Left    ANKLE   HERNIA REPAIR     Umbilical Hernia Repair, Dr. Cathlean Marseilles   ILIAC ARTERY STENT  2014   [facility]    Home Medications:  Allergies as of 01/07/2023   No Known Allergies      Medication List        Accurate as of January 07, 2023  5:32 PM. If you have any questions, ask your nurse or doctor.          acetaminophen 500 MG tablet Commonly known as: TYLENOL Take 500-1,000 mg by mouth daily as needed for moderate pain or headache.   allopurinol 300 MG tablet Commonly known as: ZYLOPRIM Take 300 mg by mouth at bedtime.   aspirin 81 MG tablet Take 81 mg by mouth at bedtime.   chlorthalidone 25 MG tablet Commonly known as: HYGROTON Take 12.5 mg by mouth daily.   diltiazem 240 MG 24 hr capsule Commonly known as: CARDIZEM CD Take 240 mg by mouth at bedtime.   finasteride 5 MG tablet Commonly known as: PROSCAR Take 1 tablet (5 mg total) by mouth at bedtime.   Fish Oil 1200 MG Caps Take 2,400 mg by mouth at bedtime.  lisinopril 40 MG tablet Commonly known as: ZESTRIL Take 40 mg by mouth daily.   Melatonin 5 MG Caps Take 10-20 mg by mouth at bedtime as needed (sleep).   metoprolol tartrate 50 MG tablet Commonly known as: LOPRESSOR Take 50 mg by mouth 2 (two) times daily.   rosuvastatin 20 MG tablet Commonly known as: CRESTOR Take 20 mg by mouth daily.   tamsulosin 0.4 MG Caps capsule Commonly known as: FLOMAX Take 1 capsule (0.4 mg total) by mouth at bedtime.   warfarin 5 MG tablet Commonly known as: COUMADIN Take 5-7.5 mg by mouth daily. Take 7.5 mg at bedtime on Mon, Wed, and Fri. Take 5 mg at bedtime on Tues, Thurs, Sat, Sun        Allergies: No Known Allergies  Family History: Family History  Problem Relation Age of Onset   Stroke Mother    Heart disease Mother     Heart disease Father    Prostate cancer Father     Social History:  reports that he has quit smoking. His smoking use included pipe. He has quit using smokeless tobacco. He reports that he does not drink alcohol and does not use drugs.   Physical Exam: BP 119/72   Pulse 88   Ht 5\' 11"  (1.803 m)   Wt 178 lb (80.7 kg)   BMI 24.83 kg/m   Constitutional:  Alert and oriented, No acute distress. HEENT: [place] AT, moist mucus membranes.  Trachea midline, no masses. Cardiovascular: No clubbing, cyanosis, or edema. Respiratory: Normal respiratory effort, no increased work of breathing. GI: Abdomen is soft, nontender, nondistended, no abdominal masses Skin: No rashes, bruises or suspicious lesions. Neurologic: Grossly intact, no focal deficits, moving all 4 extremities. Psychiatric: Normal mood and affect.   Assessment & Plan:    1. BPH with LUTS No bothersome LUTS on combination therapy with tamsulosin/finasteride.  Refills were sent to pharmacy.   2. Prostate cancer screening  He requested PSA.  We discussed prostate cancer screening guidelines no longer recommend screening after age 72, though healthy patients can extend to the mid-70s. PSA was ordered at his request.   He will continue annual follow-up.  [facility] [address], [address] [place], [place] [address] [phone]

## 2023-01-08 LAB — PSA: Prostate Specific Ag, Serum: 2.4 ng/mL (ref 0.0–4.0)

## 2023-01-09 ENCOUNTER — Encounter: Payer: Self-pay | Admitting: Urology

## 2023-05-19 ENCOUNTER — Other Ambulatory Visit: Payer: Self-pay

## 2023-05-19 ENCOUNTER — Emergency Department
Admission: EM | Admit: 2023-05-19 | Discharge: 2023-05-19 | Disposition: A | Discharge status: Home / Self Care | Attending: Emergency Medicine | Admitting: Emergency Medicine

## 2023-05-19 ENCOUNTER — Encounter: Payer: Self-pay | Admitting: Emergency Medicine

## 2023-05-19 ENCOUNTER — Emergency Department

## 2023-05-19 DIAGNOSIS — I2510 Atherosclerotic heart disease of native coronary artery without angina pectoris: Secondary | ICD-10-CM | POA: Diagnosis not present

## 2023-05-19 DIAGNOSIS — J069 Acute upper respiratory infection, unspecified: Secondary | ICD-10-CM | POA: Diagnosis not present

## 2023-05-19 DIAGNOSIS — R0981 Nasal congestion: Secondary | ICD-10-CM | POA: Diagnosis present

## 2023-05-19 DIAGNOSIS — I4891 Unspecified atrial fibrillation: Secondary | ICD-10-CM | POA: Diagnosis not present

## 2023-05-19 DIAGNOSIS — I509 Heart failure, unspecified: Secondary | ICD-10-CM | POA: Diagnosis not present

## 2023-05-19 DIAGNOSIS — Z7901 Long term (current) use of anticoagulants: Secondary | ICD-10-CM | POA: Diagnosis not present

## 2023-05-19 DIAGNOSIS — I110 Hypertensive heart disease with heart failure: Secondary | ICD-10-CM | POA: Insufficient documentation

## 2023-05-19 DIAGNOSIS — R519 Headache, unspecified: Secondary | ICD-10-CM | POA: Diagnosis not present

## 2023-05-19 LAB — BASIC METABOLIC PANEL
Anion gap: 9 (ref 5–15)
BUN: 33 mg/dL — ABNORMAL HIGH (ref 8–23)
CO2: 26 mmol/L (ref 22–32)
Calcium: 8.6 mg/dL — ABNORMAL LOW (ref 8.9–10.3)
Chloride: 102 mmol/L (ref 98–111)
Creatinine, Ser: 1.34 mg/dL — ABNORMAL HIGH (ref 0.61–1.24)
GFR, Estimated: 50 mL/min — ABNORMAL LOW (ref >60)
Glucose, Bld: 135 mg/dL — ABNORMAL HIGH (ref 70–99)
Potassium: 3.6 mmol/L (ref 3.5–5.1)
Sodium: 137 mmol/L (ref 135–145)

## 2023-05-19 LAB — RESP PANEL BY RT-PCR (RSV, FLU A&B, COVID)  RVPGX2
Influenza A by PCR: NEGATIVE
Influenza B by PCR: NEGATIVE
Resp Syncytial Virus by PCR: NEGATIVE
SARS Coronavirus 2 by RT PCR: NEGATIVE

## 2023-05-19 LAB — CBC WITH DIFFERENTIAL/PLATELET
Abs Immature Granulocytes: 0.02 10*3/uL (ref 0.00–0.07)
Basophils Absolute: 0 10*3/uL (ref 0.0–0.1)
Basophils Relative: 0 %
Eosinophils Absolute: 0.2 10*3/uL (ref 0.0–0.5)
Eosinophils Relative: 3 %
HCT: 45 % (ref 39.0–52.0)
Hemoglobin: 14.5 g/dL (ref 13.0–17.0)
Immature Granulocytes: 0 %
Lymphocytes Relative: 7 %
Lymphs Abs: 0.5 10*3/uL — ABNORMAL LOW (ref 0.7–4.0)
MCH: 31.8 pg (ref 26.0–34.0)
MCHC: 32.2 g/dL (ref 30.0–36.0)
MCV: 98.7 fL (ref 80.0–100.0)
Monocytes Absolute: 0.9 10*3/uL (ref 0.1–1.0)
Monocytes Relative: 13 %
Neutro Abs: 5.5 10*3/uL (ref 1.7–7.7)
Neutrophils Relative %: 77 %
Platelets: 148 10*3/uL — ABNORMAL LOW (ref 150–400)
RBC: 4.56 MIL/uL (ref 4.22–5.81)
RDW: 14.4 % (ref 11.5–15.5)
WBC: 7.2 10*3/uL (ref 4.0–10.5)
nRBC: 0.3 % — ABNORMAL HIGH (ref 0.0–0.2)

## 2023-05-19 MED ORDER — METOCLOPRAMIDE HCL 5 MG/ML IJ SOLN
10.0000 mg | Freq: Once | INTRAMUSCULAR | Status: AC
  Administered 2023-05-19: 10 mg via INTRAVENOUS
  Filled 2023-05-19: qty 2

## 2023-05-19 MED ORDER — METOCLOPRAMIDE HCL 10 MG PO TABS: 10.0000 mg | ORAL_TABLET | Freq: Four times a day (QID) | ORAL | 0 refills | Status: AC | PRN

## 2023-05-19 MED ORDER — SODIUM CHLORIDE 0.9 % IV BOLUS
500.0000 mL | Freq: Once | INTRAVENOUS | Status: AC
  Administered 2023-05-19: 500 mL via INTRAVENOUS

## 2023-05-19 NOTE — Discharge Instructions (Signed)
 Follow-up with your primary care provider.  Take Tylenol or ibuprofen as needed for the headache and continue Mucinex for your upper respiratory symptoms.  Return to the ER for new, worsening, or persistent severe headache, dizziness, weakness, or any other new or worsening symptoms that concern you.

## 2023-05-19 NOTE — ED Provider Notes (Signed)
 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  The patient states that he has had nasal congestion and rhinorrhea for about the last 4 days as well as a nonproductive cough.  He denies any shortness of breath or chest pain.  He states that today he started having a headache focused in the left back part of his head.  He states that is more severe than headaches he has previously had.  He denies any trauma to the head.  He has no neck pain or stiffness.  Denies any fever or chills.  He has no vomiting or diarrhea.  He denies any vision changes or any weakness or numbness.  He also reports some recent balance issues although states that this is not acutely worse today.  He does feel generally somewhat weak.  I reviewed the past medical records.  The patient was seen by Dr. Clelia Croft from neurology on 3/5 due to the balance issues.  There was some concern for hypotension/near syncope.  His workup overall was negative.   Physical Exam   Triage Vital Signs: ED Triage Vitals  Encounter Vitals Group     BP 05/19/23 1247 110/69     Systolic BP Percentile --      Diastolic BP Percentile --      Pulse Rate 05/19/23 1247 (!) 112     Resp 05/19/23 1247 18     Temp --      Temp src --      SpO2 05/19/23 1247 97 %     Weight 05/19/23 1246 160 lb (72.6 kg)     Height 05/19/23 1246 5\' 11"  (1.803 m)     Head Circumference --      Peak Flow --      Pain Score 05/19/23 1245 10     Pain Loc --      Pain Education --      Exclude from Growth Chart --     Most recent vital signs: Vitals:   05/19/23 1247 05/19/23 1327  BP: 110/69   Pulse: (!) 112   Resp: 18   Temp:   97.9 F (36.6 C)  SpO2: 97%      General: Alert, well-appearing for age, no distress.  CV:  Good peripheral perfusion.  Resp:  Normal effort.  Abd:  No distention.  Other:  EOMI.  PERRLA.  No photophobia.  Normal speech.  No facial droop.  Motor intact in all extremities.  Normal coordination.  Neck supple, full range of motion.  No meningeal signs.   ED Results / Procedures / Treatments   Labs (all labs ordered are listed, but only abnormal results are displayed) Labs Reviewed  BASIC METABOLIC PANEL - Abnormal; Notable for the following components:      Result Value   Glucose, Bld 135 (*)    BUN 33 (*)    Creatinine, Ser 1.34 (*)    Calcium 8.6 (*)    GFR, Estimated 50 (*)    All other components within normal limits  CBC WITH DIFFERENTIAL/PLATELET - Abnormal; Notable for the following components:   Platelets 148 (*)  nRBC 0.3 (*)    Lymphs Abs 0.5 (*)    All other components within normal limits  RESP PANEL BY RT-PCR (RSV, FLU A&B, COVID)  [id number]     EKG  ED ECG REPORT I, Dionne Bucy, the attending physician, personally viewed and interpreted this ECG.  Date: 05/19/2023 EKG Time: 1245 Rate: 111 Rhythm: Atrial fibrillation QRS Axis: normal Intervals: Nonspecific IVCD ST/T Wave abnormalities: Nonspecific T wave abnormalities Narrative Interpretation: no evidence of acute ischemia; no significant change when compared to EKG of 08/08/2022    RADIOLOGY  CT head: I independently viewed and interpreted the images; there is no ICH.  Radiology report indicates no acute abnormality   PROCEDURES:  Critical Care performed: No  Procedures   MEDICATIONS ORDERED IN ED: Medications  sodium chloride 0.9 % bolus 500 mL (500 mLs Intravenous New Bag/Given 05/19/23 1429)  metoCLOPramide (REGLAN) injection 10 mg (10 mg Intravenous Given 05/19/23 1429)     IMPRESSION / MDM / ASSESSMENT AND PLAN / ED COURSE  I reviewed the triage vital signs and the nursing  notes.  89 year old male with PMH as noted above presents with URI symptoms for the last several days with acute onset of headache and some generalized weakness this morning.  On exam the patient is overall well-appearing for his age.  He is tachycardic with otherwise normal vital signs except for borderline tachycardia.  There are neurologic exam is normal.  There are no meningeal signs.  Differential diagnosis includes, but is not limited to, viral URI, influenza or other viral syndrome, tension headache, muscle strain or spasm, migraine, other headache syndrome.  Given the patient's age and change in headache pattern we will obtain a CT as well as basic labs, respiratory panel to rule out influenza or other viral syndrome, and reassess.  Patient's presentation is most consistent with acute complicated illness / injury requiring diagnostic workup.  The patient is on the cardiac monitor to evaluate for evidence of arrhythmia and/or significant heart rate changes.  ----------------------------------------- 3:07 PM on 05/19/2023 -----------------------------------------  BMP is unremarkable and shows CKD.  CBC shows no acute findings.  Respiratory panel is negative.  CT is negative.  On reassessment, the patient's headache is significantly improved after fluids and Reglan.  He feels well and would like to go home.  Given the negative workup, he is stable for discharge home.  I counseled him on the results of the workup and plan of care.  I gave strict return precautions and he expressed understanding.   FINAL CLINICAL IMPRESSION(S) / ED DIAGNOSES   Final diagnoses:  Viral URI  Acute nonintractable headache, unspecified headache type     Rx / DC Orders   ED Discharge Orders     None        Note:  This document was prepared using Dragon voice recognition software and may include unintentional dictation errors.    Dionne Bucy, MD 05/19/23 [phone]

## 2023-05-19 NOTE — ED Triage Notes (Signed)
 Pt via [facility] from home. pt c/o weakness, headache, cough, nasal congestion for the past couple of days. States he has been taking OTC medication with not relief. Pt has a hx of a fib, takes warfarin for the same. Denies any NVD. Pt is A&OX4 and NAD 107/52 BP  119 HR  96% on RA, lung are clear bilaterally per EMS

## 2023-11-13 ENCOUNTER — Telehealth: Payer: Self-pay

## 2023-11-13 ENCOUNTER — Other Ambulatory Visit: Payer: Self-pay | Admitting: *Deleted

## 2023-11-13 MED ORDER — TAMSULOSIN HCL 0.4 MG PO CAPS: 0.4000 mg | ORAL_CAPSULE | Freq: Every day | ORAL | 0 refills | Status: DC

## 2023-11-13 NOTE — Telephone Encounter (Signed)
 Pt called in with c/o nocturia lately and was wondering if taking the Flomax  in the evening could be the cause of this his night time urination. We discussed maybe seeing if taking his flomax  in the morning helped with this until he can be seen be urologist soon. Pt agreed. We discussed not taking his med this evening and starting it new in the morning after breakfast. Pt stated understanding and he would give us  a call back if needed.

## 2023-11-13 NOTE — Addendum Note (Signed)
 Addended by: GENITA HARLENE CROME on: 11/13/2023 11:32 AM   Modules accepted: Orders

## 2024-01-08 ENCOUNTER — Ambulatory Visit (INDEPENDENT_AMBULATORY_CARE_PROVIDER_SITE_OTHER): Payer: Self-pay | Admitting: Urology

## 2024-01-08 ENCOUNTER — Encounter: Payer: Self-pay | Admitting: Urology

## 2024-01-08 VITALS — BP 108/61 | HR 75 | Ht 71.0 in | Wt 170.0 lb

## 2024-01-08 DIAGNOSIS — N401 Enlarged prostate with lower urinary tract symptoms: Secondary | ICD-10-CM

## 2024-01-08 DIAGNOSIS — R351 Nocturia: Secondary | ICD-10-CM | POA: Diagnosis not present

## 2024-01-08 MED ORDER — TAMSULOSIN HCL 0.4 MG PO CAPS: 0.4000 mg | ORAL_CAPSULE | Freq: Every day | ORAL | 0 refills | Status: AC

## 2024-01-08 MED ORDER — FINASTERIDE 5 MG PO TABS: 5.0000 mg | ORAL_TABLET | Freq: Every day | ORAL | 3 refills | Status: AC

## 2024-01-08 NOTE — Progress Notes (Signed)
 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  Stable lower urinary tract symptoms on tamsulosin /finasteride  He does get up several times per night to void and has normal urine volumes Denies history of sleep apnea/snoring No dysuria or gross hematuria   PMH: Past Medical History:  Diagnosis Date   Atrial fibrillation (HCC)    BPH (benign prostatic hyperplasia)    CHF (congestive heart failure) (HCC)    1999   Chronic kidney disease    Kidney stones in past   Coronary artery disease    Dysrhythmia    Gout    Hyperlipidemia    Hypertension     Surgical History: Past Surgical History:  Procedure Laterality Date   ABDOMINAL AORTIC ANEURYSM REPAIR  2000   [facility]   CARDIAC CATHETERIZATION     CARPAL TUNNEL RELEASE Right 02/25/2015   Procedure: CARPAL TUNNEL RELEASE;  Surgeon: Lonni Sharps, MD;  Location: [facility] ORS;  Service: Orthopedics;  Laterality: Right;   CATARACT EXTRACTION W/PHACO Right 11/19/2017   Procedure: CATARACT EXTRACTION PHACO AND INTRAOCULAR LENS PLACEMENT (IOC);  Surgeon: Jaye Fallow, MD;  Location: [facility] ORS;  Service: Ophthalmology;  Laterality: Right;  US  00:48 AP% 13.7 CDE 6.61 Fluid pack lot # [id number]   CATARACT EXTRACTION W/PHACO Left 12/17/2017   Procedure: CATARACT EXTRACTION PHACO AND INTRAOCULAR LENS PLACEMENT (IOC);  Surgeon: Jaye Fallow, MD;  Location: [facility] ORS;  Service: Ophthalmology;  Laterality: Left;  US  00:44 CDE 6.11 Fluid pack lot # [id number]   CORONARY ANGIOPLASTY  2000   STENT   FEMORAL-FEMORAL BYPASS GRAFT  2002   [facility]   FRACTURE SURGERY Left    ANKLE   HERNIA REPAIR     Umbilical Hernia Repair, Dr. JINNY Bibles   ILIAC ARTERY STENT  2014   [facility]    Home Medications:  Allergies as of 01/08/2024   No Known Allergies      Medication List        Accurate as of January 08, 2024  1:02 PM. If you have any questions, ask your nurse or doctor.          acetaminophen 500 MG tablet Commonly known as: TYLENOL Take 500-1,000 mg by mouth daily as needed for moderate pain or headache.   allopurinol 300 MG tablet Commonly known as: ZYLOPRIM Take 300 mg by mouth at bedtime.   aspirin 81 MG tablet Take 81 mg by mouth at bedtime.   chlorthalidone 25 MG tablet Commonly known as: HYGROTON Take 12.5 mg by mouth daily.   diltiazem  240 MG 24 hr capsule Commonly known as: CARDIZEM  CD Take 240 mg by mouth at bedtime.   finasteride  5 MG tablet Commonly known as: PROSCAR  Take 1 tablet (5 mg total) by mouth at bedtime.   Fish Oil 1200 MG Caps Take 2,400 mg by mouth at bedtime.   lisinopril 40 MG tablet Commonly known as: ZESTRIL Take 40 mg by mouth daily.   Melatonin 5 MG Caps Take 10-20 mg by mouth at bedtime as needed (sleep).   metoCLOPramide  10 MG tablet Commonly known as: REGLAN  Take 1 tablet (10 mg total) by mouth every 6 (six) hours as needed  for nausea (headache).   metoprolol  tartrate 50 MG tablet Commonly known as: LOPRESSOR  Take 50 mg by mouth 2 (two) times daily.   rosuvastatin 20 MG tablet Commonly known as: CRESTOR Take 20 mg by mouth daily.   tamsulosin  0.4 MG Caps capsule Commonly known as: FLOMAX  Take 1 capsule (0.4 mg total) by mouth at bedtime.   warfarin 5 MG tablet Commonly known as: COUMADIN Take 5-7.5 mg by mouth daily. Take 7.5 mg at bedtime on Mon, Wed, and Fri. Take 5 mg at bedtime on Tues, Thurs, Sat, Sun        Allergies: No Known Allergies  Family History: Family History  Problem Relation Age of Onset   Stroke Mother    Heart disease Mother    Heart disease Father    Prostate cancer Father     Social History:   reports that he has quit smoking. His smoking use included pipe. He has quit using smokeless tobacco. He reports that he does not drink alcohol and does not use drugs.   Physical Exam: BP 108/61   Pulse 75   Ht 5' 11 (1.803 m)   Wt 170 lb (77.1 kg)   BMI 23.71 kg/m   Constitutional:  Alert, No acute distress. HEENT: [place] AT Respiratory: Normal respiratory effort, no increased work of breathing. Psychiatric: Normal mood and affect.   Assessment & Plan:    1.  BPH with LUTS Stable Tamsulosin /finasteride  refilled He requested a PSA.  Again discussed prostate cancer screening guidelines is no longer recommended after age 44  2.  Nocturia We discussed other common causes of nocturia including nocturnal polyuria and sleep apnea    Glendia JAYSON Barba, MD  [facility] [address], [address] [place], [place] [address] [phone]

## 2024-01-09 ENCOUNTER — Ambulatory Visit: Payer: Self-pay | Admitting: Urology

## 2024-01-09 LAB — PSA: Prostate Specific Ag, Serum: 2.3 ng/mL (ref 0.0–4.0)

## 2025-01-08 ENCOUNTER — Ambulatory Visit: Admitting: Urology
